# Patient Record
Sex: Female | Born: 1990 | Race: White | Hispanic: No | Marital: Married | Smoking: Never smoker
Health system: Southern US, Community
[De-identification: ages and names within clinical notes are randomized; demographics above are authoritative.]

## PROBLEM LIST (undated history)

## (undated) ENCOUNTER — Inpatient Hospital Stay (HOSPITAL_COMMUNITY): Payer: Self-pay

## (undated) DIAGNOSIS — F32A Depression, unspecified: Secondary | ICD-10-CM

## (undated) DIAGNOSIS — K219 Gastro-esophageal reflux disease without esophagitis: Secondary | ICD-10-CM

## (undated) DIAGNOSIS — R51 Headache: Secondary | ICD-10-CM

## (undated) DIAGNOSIS — F329 Major depressive disorder, single episode, unspecified: Secondary | ICD-10-CM

## (undated) HISTORY — PX: TUBAL LIGATION: SHX77

## (undated) HISTORY — PX: SHOULDER ARTHROSCOPY: SHX128

## (undated) HISTORY — PX: CARPAL TUNNEL RELEASE: SHX101

---
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
## 2009-08-20 ENCOUNTER — Encounter: Admission: RE | Admit: 2009-08-20 | Discharge: 2009-08-20 | Payer: Self-pay | Admitting: Family Medicine

## 2010-07-04 ENCOUNTER — Encounter: Payer: Self-pay | Admitting: Family Medicine

## 2012-01-10 LAB — OB RESULTS CONSOLE GC/CHLAMYDIA: Chlamydia: NEGATIVE

## 2012-06-28 LAB — OB RESULTS CONSOLE GBS: GBS: POSITIVE

## 2012-07-03 ENCOUNTER — Inpatient Hospital Stay (HOSPITAL_COMMUNITY)
Admission: AD | Admit: 2012-07-03 | Discharge: 2012-07-03 | Disposition: A | Discharge status: Home / Self Care | Payer: Managed Care, Other (non HMO) | Source: Ambulatory Visit | Attending: Obstetrics and Gynecology | Admitting: Obstetrics and Gynecology

## 2012-07-03 ENCOUNTER — Encounter (HOSPITAL_COMMUNITY): Payer: Self-pay

## 2012-07-03 DIAGNOSIS — N949 Unspecified condition associated with female genital organs and menstrual cycle: Secondary | ICD-10-CM | POA: Insufficient documentation

## 2012-07-03 DIAGNOSIS — O479 False labor, unspecified: Secondary | ICD-10-CM | POA: Insufficient documentation

## 2012-07-03 HISTORY — DX: Major depressive disorder, single episode, unspecified: F32.9

## 2012-07-03 HISTORY — DX: Headache: R51

## 2012-07-03 HISTORY — DX: Depression, unspecified: F32.A

## 2012-07-03 NOTE — MAU Note (Signed)
Perineum dry prior to exam.  No evidence of fluid on pad.  Small amt of normal white discharge noted on glove with exam, no fluid noted with exam.

## 2012-07-03 NOTE — MAU Note (Signed)
Pink spot at 0600, contracting during the night ~q .  Water leaking around 0800, clear fluid, still coming.

## 2012-07-03 NOTE — MAU Note (Signed)
Patient states she has been having contractions about every 10 minutes. States when she woke up with pinkish leaking. Not actively leaking at this time and not wearing a pad. Reports good fetal movement.

## 2012-07-04 ENCOUNTER — Inpatient Hospital Stay (HOSPITAL_COMMUNITY): Payer: Managed Care, Other (non HMO) | Admitting: Anesthesiology

## 2012-07-04 ENCOUNTER — Encounter (HOSPITAL_COMMUNITY): Payer: Self-pay

## 2012-07-04 ENCOUNTER — Encounter (HOSPITAL_COMMUNITY): Payer: Self-pay | Admitting: Anesthesiology

## 2012-07-04 ENCOUNTER — Encounter (HOSPITAL_COMMUNITY)
Admission: AD | Disposition: A | Discharge status: Home / Self Care | Payer: Self-pay | Source: Ambulatory Visit | Attending: Obstetrics and Gynecology

## 2012-07-04 ENCOUNTER — Inpatient Hospital Stay (HOSPITAL_COMMUNITY)
Admission: AD | Admit: 2012-07-04 | Discharge: 2012-07-06 | DRG: 766 | Disposition: A | Discharge status: Home / Self Care | Payer: Managed Care, Other (non HMO) | Source: Ambulatory Visit | Attending: Obstetrics and Gynecology | Admitting: Obstetrics and Gynecology

## 2012-07-04 DIAGNOSIS — O324XX0 Maternal care for high head at term, not applicable or unspecified: Secondary | ICD-10-CM | POA: Diagnosis present

## 2012-07-04 LAB — OB RESULTS CONSOLE ABO/RH: RH Type: POSITIVE

## 2012-07-04 LAB — CBC
HCT: 35.3 % — ABNORMAL LOW (ref 36.0–46.0)
Hemoglobin: 11.6 g/dL — ABNORMAL LOW (ref 12.0–15.0)
MCHC: 32.9 g/dL (ref 30.0–36.0)
MCV: 81.5 fL (ref 78.0–100.0)
RDW: 13.2 % (ref 11.5–15.5)
WBC: 14.7 10*3/uL — ABNORMAL HIGH (ref 4.0–10.5)

## 2012-07-04 LAB — OB RESULTS CONSOLE RPR: RPR: NONREACTIVE

## 2012-07-04 LAB — OB RESULTS CONSOLE RUBELLA ANTIBODY, IGM: Rubella: NON-IMMUNE/NOT IMMUNE

## 2012-07-04 LAB — OB RESULTS CONSOLE HEPATITIS B SURFACE ANTIGEN: Hepatitis B Surface Ag: NEGATIVE

## 2012-07-04 SURGERY — Surgical Case
Anesthesia: Epidural | Site: Abdomen | Wound class: Clean Contaminated

## 2012-07-04 MED ORDER — DIPHENHYDRAMINE HCL 50 MG/ML IJ SOLN: 25.0000 mg | INTRAMUSCULAR | Status: DC | PRN

## 2012-07-04 MED ORDER — NALBUPHINE HCL 10 MG/ML IJ SOLN: 5.0000 mg | INTRAMUSCULAR | Status: DC | PRN

## 2012-07-04 MED ORDER — SODIUM CHLORIDE 0.9 % IV SOLN
2.0000 g | Freq: Four times a day (QID) | INTRAVENOUS | Status: DC
  Administered 2012-07-04: 2 g via INTRAVENOUS
  Filled 2012-07-04 (×3): qty 2000

## 2012-07-04 MED ORDER — DIPHENHYDRAMINE HCL 50 MG/ML IJ SOLN: 12.5000 mg | INTRAMUSCULAR | Status: DC | PRN

## 2012-07-04 MED ORDER — TETANUS-DIPHTH-ACELL PERTUSSIS 5-2.5-18.5 LF-MCG/0.5 IM SUSP: 0.5000 mL | Freq: Once | INTRAMUSCULAR | Status: DC

## 2012-07-04 MED ORDER — OXYTOCIN 40 UNITS IN LACTATED RINGERS INFUSION - SIMPLE MED: 62.5000 mL/h | INTRAVENOUS | Status: AC

## 2012-07-04 MED ORDER — CEFAZOLIN SODIUM-DEXTROSE 2-3 GM-% IV SOLR
INTRAVENOUS | Status: DC | PRN
  Administered 2012-07-04: 2 g via INTRAVENOUS

## 2012-07-04 MED ORDER — FENTANYL 2.5 MCG/ML BUPIVACAINE 1/10 % EPIDURAL INFUSION (WH - ANES)
14.0000 mL/h | INTRAMUSCULAR | Status: DC
  Administered 2012-07-04: 14 mL/h via EPIDURAL
  Filled 2012-07-04: qty 125

## 2012-07-04 MED ORDER — DIBUCAINE 1 % RE OINT: 1.0000 "application " | TOPICAL_OINTMENT | RECTAL | Status: DC | PRN

## 2012-07-04 MED ORDER — MEPERIDINE HCL 25 MG/ML IJ SOLN
6.2500 mg | INTRAMUSCULAR | Status: DC | PRN
  Administered 2012-07-04: 6.25 mg via INTRAVENOUS

## 2012-07-04 MED ORDER — WITCH HAZEL-GLYCERIN EX PADS: 1.0000 "application " | MEDICATED_PAD | CUTANEOUS | Status: DC | PRN

## 2012-07-04 MED ORDER — MEPERIDINE HCL 25 MG/ML IJ SOLN: 6.2500 mg | INTRAMUSCULAR | Status: DC | PRN

## 2012-07-04 MED ORDER — CITRIC ACID-SODIUM CITRATE 334-500 MG/5ML PO SOLN
30.0000 mL | ORAL | Status: DC | PRN
  Administered 2012-07-04: 30 mL via ORAL
  Filled 2012-07-04: qty 15

## 2012-07-04 MED ORDER — MORPHINE SULFATE (PF) 0.5 MG/ML IJ SOLN
INTRAMUSCULAR | Status: DC | PRN
  Administered 2012-07-04: 4 mg via EPIDURAL

## 2012-07-04 MED ORDER — MIDAZOLAM HCL 2 MG/2ML IJ SOLN: 0.5000 mg | Freq: Once | INTRAMUSCULAR | Status: DC | PRN

## 2012-07-04 MED ORDER — CEFAZOLIN SODIUM-DEXTROSE 2-3 GM-% IV SOLR
INTRAVENOUS | Status: AC
  Filled 2012-07-04: qty 50

## 2012-07-04 MED ORDER — NALOXONE HCL 0.4 MG/ML IJ SOLN: 0.4000 mg | INTRAMUSCULAR | Status: DC | PRN

## 2012-07-04 MED ORDER — SIMETHICONE 80 MG PO CHEW
80.0000 mg | CHEWABLE_TABLET | Freq: Three times a day (TID) | ORAL | Status: DC
  Administered 2012-07-05 – 2012-07-06 (×5): 80 mg via ORAL

## 2012-07-04 MED ORDER — SODIUM BICARBONATE 8.4 % IV SOLN
INTRAVENOUS | Status: AC
  Filled 2012-07-04: qty 50

## 2012-07-04 MED ORDER — IBUPROFEN 600 MG PO TABS
600.0000 mg | ORAL_TABLET | Freq: Four times a day (QID) | ORAL | Status: DC
  Administered 2012-07-05 – 2012-07-06 (×4): 600 mg via ORAL
  Filled 2012-07-04 (×4): qty 1

## 2012-07-04 MED ORDER — IBUPROFEN 600 MG PO TABS: 600.0000 mg | ORAL_TABLET | Freq: Four times a day (QID) | ORAL | Status: DC | PRN

## 2012-07-04 MED ORDER — LIDOCAINE HCL (PF) 1 % IJ SOLN
30.0000 mL | INTRAMUSCULAR | Status: DC | PRN
  Filled 2012-07-04: qty 30

## 2012-07-04 MED ORDER — OXYCODONE-ACETAMINOPHEN 5-325 MG PO TABS: 1.0000 | ORAL_TABLET | ORAL | Status: DC | PRN

## 2012-07-04 MED ORDER — OXYTOCIN BOLUS FROM INFUSION: 500.0000 mL | INTRAVENOUS | Status: DC

## 2012-07-04 MED ORDER — DIPHENHYDRAMINE HCL 25 MG PO CAPS: 25.0000 mg | ORAL_CAPSULE | ORAL | Status: DC | PRN

## 2012-07-04 MED ORDER — DIPHENHYDRAMINE HCL 25 MG PO CAPS: 25.0000 mg | ORAL_CAPSULE | Freq: Four times a day (QID) | ORAL | Status: DC | PRN

## 2012-07-04 MED ORDER — MORPHINE SULFATE 0.5 MG/ML IJ SOLN
INTRAMUSCULAR | Status: AC
  Filled 2012-07-04: qty 10

## 2012-07-04 MED ORDER — MORPHINE SULFATE (PF) 0.5 MG/ML IJ SOLN
INTRAMUSCULAR | Status: DC | PRN
  Administered 2012-07-04: 1 mg via INTRAVENOUS

## 2012-07-04 MED ORDER — ZOLPIDEM TARTRATE 5 MG PO TABS: 5.0000 mg | ORAL_TABLET | Freq: Every evening | ORAL | Status: DC | PRN

## 2012-07-04 MED ORDER — OXYCODONE-ACETAMINOPHEN 5-325 MG PO TABS
1.0000 | ORAL_TABLET | ORAL | Status: DC | PRN
  Administered 2012-07-06: 1 via ORAL
  Filled 2012-07-04: qty 1
  Filled 2012-07-04: qty 2
  Filled 2012-07-04: qty 1

## 2012-07-04 MED ORDER — MENTHOL 3 MG MT LOZG: 1.0000 | LOZENGE | OROMUCOSAL | Status: DC | PRN

## 2012-07-04 MED ORDER — PHENYLEPHRINE 40 MCG/ML (10ML) SYRINGE FOR IV PUSH (FOR BLOOD PRESSURE SUPPORT)
80.0000 ug | PREFILLED_SYRINGE | INTRAVENOUS | Status: DC | PRN
  Filled 2012-07-04: qty 5

## 2012-07-04 MED ORDER — PRENATAL MULTIVITAMIN CH
1.0000 | ORAL_TABLET | Freq: Every day | ORAL | Status: DC
  Administered 2012-07-06: 1 via ORAL
  Filled 2012-07-04: qty 1

## 2012-07-04 MED ORDER — PROMETHAZINE HCL 25 MG/ML IJ SOLN: 6.2500 mg | INTRAMUSCULAR | Status: DC | PRN

## 2012-07-04 MED ORDER — LIDOCAINE-EPINEPHRINE (PF) 2 %-1:200000 IJ SOLN
INTRAMUSCULAR | Status: AC
  Filled 2012-07-04: qty 20

## 2012-07-04 MED ORDER — OXYTOCIN 40 UNITS IN LACTATED RINGERS INFUSION - SIMPLE MED
1.0000 m[IU]/min | INTRAVENOUS | Status: DC
  Administered 2012-07-04: 1 m[IU]/min via INTRAVENOUS
  Filled 2012-07-04: qty 1000

## 2012-07-04 MED ORDER — NALOXONE HCL 1 MG/ML IJ SOLN: 1.0000 ug/kg/h | INTRAVENOUS | Status: DC | PRN

## 2012-07-04 MED ORDER — SENNOSIDES-DOCUSATE SODIUM 8.6-50 MG PO TABS
2.0000 | ORAL_TABLET | Freq: Every day | ORAL | Status: DC
  Administered 2012-07-05: 2 via ORAL

## 2012-07-04 MED ORDER — ONDANSETRON HCL 4 MG/2ML IJ SOLN: 4.0000 mg | INTRAMUSCULAR | Status: DC | PRN

## 2012-07-04 MED ORDER — ONDANSETRON HCL 4 MG/2ML IJ SOLN
INTRAMUSCULAR | Status: AC
  Filled 2012-07-04: qty 2

## 2012-07-04 MED ORDER — OXYTOCIN 10 UNIT/ML IJ SOLN
INTRAMUSCULAR | Status: AC
  Filled 2012-07-04: qty 4

## 2012-07-04 MED ORDER — OXYTOCIN 40 UNITS IN LACTATED RINGERS INFUSION - SIMPLE MED: 62.5000 mL/h | INTRAVENOUS | Status: DC

## 2012-07-04 MED ORDER — ACETAMINOPHEN 325 MG PO TABS: 650.0000 mg | ORAL_TABLET | ORAL | Status: DC | PRN

## 2012-07-04 MED ORDER — ONDANSETRON HCL 4 MG/2ML IJ SOLN
4.0000 mg | Freq: Four times a day (QID) | INTRAMUSCULAR | Status: DC | PRN
  Administered 2012-07-04: 4 mg via INTRAVENOUS
  Filled 2012-07-04: qty 2

## 2012-07-04 MED ORDER — SIMETHICONE 80 MG PO CHEW: 80.0000 mg | CHEWABLE_TABLET | ORAL | Status: DC | PRN

## 2012-07-04 MED ORDER — METOCLOPRAMIDE HCL 5 MG/ML IJ SOLN: 10.0000 mg | Freq: Three times a day (TID) | INTRAMUSCULAR | Status: DC | PRN

## 2012-07-04 MED ORDER — KETOROLAC TROMETHAMINE 30 MG/ML IJ SOLN
INTRAMUSCULAR | Status: AC
  Filled 2012-07-04: qty 1

## 2012-07-04 MED ORDER — TERBUTALINE SULFATE 1 MG/ML IJ SOLN: 0.2500 mg | Freq: Once | INTRAMUSCULAR | Status: DC | PRN

## 2012-07-04 MED ORDER — LACTATED RINGERS IV SOLN
INTRAVENOUS | Status: DC
  Administered 2012-07-05: 03:00:00 via INTRAVENOUS

## 2012-07-04 MED ORDER — FLEET ENEMA 7-19 GM/118ML RE ENEM: 1.0000 | ENEMA | RECTAL | Status: DC | PRN

## 2012-07-04 MED ORDER — SCOPOLAMINE 1 MG/3DAYS TD PT72
1.0000 | MEDICATED_PATCH | Freq: Once | TRANSDERMAL | Status: DC
  Administered 2012-07-04: 1.5 mg via TRANSDERMAL

## 2012-07-04 MED ORDER — KETOROLAC TROMETHAMINE 30 MG/ML IJ SOLN: 30.0000 mg | Freq: Four times a day (QID) | INTRAMUSCULAR | Status: AC | PRN

## 2012-07-04 MED ORDER — 0.9 % SODIUM CHLORIDE (POUR BTL) OPTIME
TOPICAL | Status: DC | PRN
  Administered 2012-07-04: 1000 mL

## 2012-07-04 MED ORDER — SCOPOLAMINE 1 MG/3DAYS TD PT72
MEDICATED_PATCH | TRANSDERMAL | Status: AC
  Filled 2012-07-04: qty 1

## 2012-07-04 MED ORDER — SODIUM BICARBONATE 8.4 % IV SOLN
INTRAVENOUS | Status: DC | PRN
  Administered 2012-07-04: 5 mL via EPIDURAL

## 2012-07-04 MED ORDER — MEPERIDINE HCL 25 MG/ML IJ SOLN
INTRAMUSCULAR | Status: AC
  Filled 2012-07-04: qty 1

## 2012-07-04 MED ORDER — ONDANSETRON HCL 4 MG/2ML IJ SOLN
INTRAMUSCULAR | Status: DC | PRN
  Administered 2012-07-04: 4 mg via INTRAVENOUS

## 2012-07-04 MED ORDER — LACTATED RINGERS IV SOLN
500.0000 mL | INTRAVENOUS | Status: DC | PRN
  Administered 2012-07-04 (×2): 300 mL via INTRAVENOUS

## 2012-07-04 MED ORDER — EPHEDRINE 5 MG/ML INJ
10.0000 mg | INTRAVENOUS | Status: DC | PRN
  Filled 2012-07-04: qty 4

## 2012-07-04 MED ORDER — KETOROLAC TROMETHAMINE 30 MG/ML IJ SOLN
30.0000 mg | Freq: Four times a day (QID) | INTRAMUSCULAR | Status: AC | PRN
  Administered 2012-07-04: 30 mg via INTRAVENOUS

## 2012-07-04 MED ORDER — ONDANSETRON HCL 4 MG PO TABS: 4.0000 mg | ORAL_TABLET | ORAL | Status: DC | PRN

## 2012-07-04 MED ORDER — CEFAZOLIN SODIUM 1-5 GM-% IV SOLN
1.0000 g | Freq: Four times a day (QID) | INTRAVENOUS | Status: AC
  Administered 2012-07-05 (×2): 1 g via INTRAVENOUS
  Filled 2012-07-04 (×2): qty 50

## 2012-07-04 MED ORDER — LIDOCAINE HCL (PF) 1 % IJ SOLN
INTRAMUSCULAR | Status: DC | PRN
  Administered 2012-07-04 (×2): 5 mL

## 2012-07-04 MED ORDER — CEFAZOLIN SODIUM-DEXTROSE 2-3 GM-% IV SOLR: 2.0000 g | INTRAVENOUS | Status: DC

## 2012-07-04 MED ORDER — LACTATED RINGERS IV SOLN
INTRAVENOUS | Status: DC
  Administered 2012-07-04 (×5): via INTRAVENOUS

## 2012-07-04 MED ORDER — FENTANYL CITRATE 0.05 MG/ML IJ SOLN: 25.0000 ug | INTRAMUSCULAR | Status: DC | PRN

## 2012-07-04 MED ORDER — LANOLIN HYDROUS EX OINT: 1.0000 "application " | TOPICAL_OINTMENT | CUTANEOUS | Status: DC | PRN

## 2012-07-04 MED ORDER — SODIUM CHLORIDE 0.9 % IV SOLN
2.0000 g | Freq: Once | INTRAVENOUS | Status: AC
  Administered 2012-07-04: 2 g via INTRAVENOUS
  Filled 2012-07-04: qty 2000

## 2012-07-04 MED ORDER — PHENYLEPHRINE 40 MCG/ML (10ML) SYRINGE FOR IV PUSH (FOR BLOOD PRESSURE SUPPORT): 80.0000 ug | PREFILLED_SYRINGE | INTRAVENOUS | Status: DC | PRN

## 2012-07-04 MED ORDER — ONDANSETRON HCL 4 MG/2ML IJ SOLN: 4.0000 mg | Freq: Three times a day (TID) | INTRAMUSCULAR | Status: DC | PRN

## 2012-07-04 MED ORDER — ACETAMINOPHEN 10 MG/ML IV SOLN: 1000.0000 mg | Freq: Four times a day (QID) | INTRAVENOUS | Status: AC | PRN

## 2012-07-04 MED ORDER — LACTATED RINGERS IV SOLN
500.0000 mL | Freq: Once | INTRAVENOUS | Status: AC
  Administered 2012-07-04: 500 mL via INTRAVENOUS

## 2012-07-04 MED ORDER — BUTORPHANOL TARTRATE 1 MG/ML IJ SOLN
1.0000 mg | INTRAMUSCULAR | Status: DC | PRN
  Administered 2012-07-04 (×2): 1 mg via INTRAVENOUS
  Filled 2012-07-04 (×2): qty 1

## 2012-07-04 MED ORDER — SODIUM CHLORIDE 0.9 % IJ SOLN: 3.0000 mL | INTRAMUSCULAR | Status: DC | PRN

## 2012-07-04 MED ORDER — EPHEDRINE 5 MG/ML INJ: 10.0000 mg | INTRAVENOUS | Status: DC | PRN

## 2012-07-04 MED ORDER — OXYTOCIN 10 UNIT/ML IJ SOLN
40.0000 [IU] | INTRAVENOUS | Status: DC | PRN
  Administered 2012-07-04: 40 [IU] via INTRAVENOUS

## 2012-07-04 SURGICAL SUPPLY — 38 items
ADH SKN CLS APL DERMABOND .7 (GAUZE/BANDAGES/DRESSINGS)
APL SKNCLS STERI-STRIP NONHPOA (GAUZE/BANDAGES/DRESSINGS) ×2
BENZOIN TINCTURE PRP APPL 2/3 (GAUZE/BANDAGES/DRESSINGS) ×2 IMPLANT
CLOTH BEACON ORANGE TIMEOUT ST (SAFETY) ×2 IMPLANT
CONTAINER PREFILL 10% NBF 15ML (MISCELLANEOUS) IMPLANT
DERMABOND ADVANCED (GAUZE/BANDAGES/DRESSINGS)
DERMABOND ADVANCED .7 DNX12 (GAUZE/BANDAGES/DRESSINGS) IMPLANT
DRAPE LG THREE QUARTER DISP (DRAPES) ×2 IMPLANT
DRESSING TELFA 8X3 (GAUZE/BANDAGES/DRESSINGS) IMPLANT
DRSG OPSITE POSTOP 4X10 (GAUZE/BANDAGES/DRESSINGS) ×2 IMPLANT
DURAPREP 26ML APPLICATOR (WOUND CARE) ×2 IMPLANT
ELECT REM PT RETURN 9FT ADLT (ELECTROSURGICAL) ×2
ELECTRODE REM PT RTRN 9FT ADLT (ELECTROSURGICAL) ×1 IMPLANT
EXTRACTOR VACUUM M CUP 4 TUBE (SUCTIONS) IMPLANT
GAUZE SPONGE 4X4 12PLY STRL LF (GAUZE/BANDAGES/DRESSINGS) ×4 IMPLANT
GLOVE BIO SURGEON STRL SZ8 (GLOVE) ×4 IMPLANT
GOWN PREVENTION PLUS LG XLONG (DISPOSABLE) ×2 IMPLANT
KIT ABG SYR 3ML LUER SLIP (SYRINGE) ×2 IMPLANT
NDL HYPO 25X5/8 SAFETYGLIDE (NEEDLE) ×1 IMPLANT
NEEDLE HYPO 25X5/8 SAFETYGLIDE (NEEDLE) ×2 IMPLANT
NS IRRIG 1000ML POUR BTL (IV SOLUTION) ×2 IMPLANT
PACK C SECTION WH (CUSTOM PROCEDURE TRAY) ×2 IMPLANT
PAD ABD 7.5X8 STRL (GAUZE/BANDAGES/DRESSINGS) IMPLANT
PAD OB MATERNITY 4.3X12.25 (PERSONAL CARE ITEMS) ×2 IMPLANT
SLEEVE SCD COMPRESS KNEE MED (MISCELLANEOUS) IMPLANT
STAPLER VISISTAT 35W (STAPLE) IMPLANT
STRIP CLOSURE SKIN 1/2X4 (GAUZE/BANDAGES/DRESSINGS) ×2 IMPLANT
SUT CHROMIC 2 0 SH (SUTURE) ×2 IMPLANT
SUT MNCRL 0 VIOLET CTX 36 (SUTURE) ×5 IMPLANT
SUT MONOCRYL 0 CTX 36 (SUTURE) ×5
SUT PDS AB 0 CTX 60 (SUTURE) ×2 IMPLANT
SUT PLAIN 0 NONE (SUTURE) IMPLANT
SUT PLAIN 2 0 (SUTURE) ×2
SUT PLAIN ABS 2-0 CT1 27XMFL (SUTURE) IMPLANT
SUT VIC AB 4-0 KS 27 (SUTURE) ×2 IMPLANT
TOWEL OR 17X24 6PK STRL BLUE (TOWEL DISPOSABLE) ×6 IMPLANT
TRAY FOLEY CATH 14FR (SET/KITS/TRAYS/PACK) ×1 IMPLANT
WATER STERILE IRR 1000ML POUR (IV SOLUTION) ×1 IMPLANT

## 2012-07-04 NOTE — Progress Notes (Signed)
C/C/+2 with some caput FHT + accels Pushing with good effort-getting tired D/W patient-will push and check in about 30 min

## 2012-07-04 NOTE — Anesthesia Postprocedure Evaluation (Signed)
  Anesthesia Post-op Note  Patient: Lisa Bates  Procedure(s) Performed: Procedure(s) (LRB) with comments: CESAREAN SECTION (N/A) - Primary Cesarean Section Delivery Baby Girl @ 1840, Apgars 3/9   Patient is awake, responsive, moving her legs, and has signs of resolution of her numbness. Pain and nausea are reasonably well controlled. Vital signs are stable and clinically acceptable. Oxygen saturation is clinically acceptable. There are no apparent anesthetic complications at this time. Patient is ready for discharge.

## 2012-07-04 NOTE — Anesthesia Procedure Notes (Signed)
Epidural Patient location during procedure: OB Start time: 07/04/2012 12:18 PM  Staffing Anesthesiologist: Angus Seller., Harrell Gave. Performed by: anesthesiologist   Preanesthetic Checklist Completed: patient identified, site marked, surgical consent, pre-op evaluation, timeout performed, IV checked, risks and benefits discussed and monitors and equipment checked  Epidural Patient position: sitting Prep: site prepped and draped and DuraPrep Patient monitoring: continuous pulse ox and blood pressure Approach: midline Injection technique: LOR air and LOR saline  Needle:  Needle type: Tuohy  Needle gauge: 17 G Needle length: 9 cm and 9 Needle insertion depth: 5 cm cm Catheter type: closed end flexible Catheter size: 19 Gauge Catheter at skin depth: 10 cm Test dose: negative  Assessment Events: blood not aspirated, injection not painful, no injection resistance, negative IV test and no paresthesia  Additional Notes Patient identified.  Risk benefits discussed including failed block, incomplete pain control, headache, nerve damage, paralysis, blood pressure changes, nausea, vomiting, reactions to medication both toxic or allergic, and postpartum back pain.  Patient expressed understanding and wished to proceed.  All questions were answered.  Sterile technique used throughout procedure and epidural site dressed with sterile barrier dressing. No paresthesia or other complications noted.The patient did not experience any signs of intravascular injection such as tinnitus or metallic taste in mouth nor signs of intrathecal spread such as rapid motor block. Please see nursing notes for vital signs.

## 2012-07-04 NOTE — Progress Notes (Signed)
FHT + accels, some early decels with UCs UCs about q 2-7 min Pitocin on Cx no change per nurse check

## 2012-07-04 NOTE — MAU Note (Signed)
Notified Dr. Henderson Cloud patient G1 [redacted]w[redacted]d labor evaluation, patient has been observed for a time with a cervical change was seen yesterday ft, today 4/70/-2 intact, GBS positive, MD to place admission orders.

## 2012-07-04 NOTE — H&P (Signed)
Lisa Bates is a 22 y.o. female presenting for C/O UCs. No ROM, no HA/vision change, no epigastric pain. Maternal Medical History:  Reason for admission: Reason for admission: contractions.  Contractions: Onset was 6-12 hours ago.   Frequency: irregular.   Perceived severity is moderate.    Fetal activity: Perceived fetal activity is normal.      OB History    Grav Para Term Preterm Abortions TAB SAB Ect Mult Living   1              Past Medical History  Diagnosis Date  . Headache   . Depression     not on meds, doing good   Past Surgical History  Procedure Date  . No past surgeries    Family History: family history is negative for Other. Social History:  reports that she has never smoked. She has never used smokeless tobacco. She reports that she does not drink alcohol or use illicit drugs.   Prenatal Transfer Tool  Maternal Diabetes: No Genetic Screening: Normal Maternal Ultrasounds/Referrals: Normal Fetal Ultrasounds or other Referrals:  None Maternal Substance Abuse:  No Significant Maternal Medications:  None Significant Maternal Lab Results:  None Other Comments:  None  Review of Systems  Eyes: Negative for blurred vision.  Gastrointestinal: Negative for abdominal pain.  Neurological: Negative for headaches.    Dilation: 4 Effacement (%): 100 Station: -1 Exam by:: Dr Henderson Cloud Blood pressure 137/82, pulse 78, temperature 97.3 F (36.3 C), temperature source Oral, resp. rate 18, height 5\' 4"  (1.626 m), weight 158 lb (71.668 kg), SpO2 100.00%. Maternal Exam:  Uterine Assessment: Contraction strength is moderate.  Contraction frequency is irregular.   Abdomen: Fetal presentation: vertex     Fetal Exam Fetal Monitor Review: Pattern: accelerations present.       Physical Exam  Cardiovascular: Normal rate and regular rhythm.   Respiratory: Effort normal and breath sounds normal.  GI: There is no tenderness.  Neurological: She has normal reflexes.     AROM clear Prenatal labs: ABO, Rh:   Antibody:   Rubella:   RPR:    HBsAg:    HIV:    GBS:     Assessment/Plan: 22 yo G1P0 at 57 3/7 weeks in labor.   Lisa Bates,Lisa Bates 07/04/2012, 9:17 AM

## 2012-07-04 NOTE — MAU Note (Signed)
contractions 

## 2012-07-04 NOTE — Progress Notes (Signed)
Henderson Cloud, MD, at bedside. Informs patient of the risks and benefits of a cesarean section. Patient verbalizes understanding. Consents signed. Patient prepped for OR.

## 2012-07-04 NOTE — Transfer of Care (Signed)
Immediate Anesthesia Transfer of Care Note  Patient: Lisa Bates  Procedure(s) Performed: Procedure(s) (LRB) with comments: CESAREAN SECTION (N/A) - Primary Cesarean Section Delivery Baby Girl @ 1840, Apgars 3/9   Patient Location: PACU  Anesthesia Type:Epidural  Level of Consciousness: awake  Airway & Oxygen Therapy: Patient Spontanous Breathing  Post-op Assessment: Report given to PACU RN and Post -op Vital signs reviewed and stable  Post vital signs: stable  Complications: No apparent anesthesia complications

## 2012-07-04 NOTE — Progress Notes (Signed)
Pushing x 1.75hours, exhausted No progress, vtx at +1 to +2 with caput FHT reactive D/W patient arrest of descent, rec: cesarean section D/W risks-infection, organ damage, bleeding/transfusion-HIV/Hep, DVT/PE, pneumonia. All questions answered.

## 2012-07-04 NOTE — Brief Op Note (Signed)
07/04/2012  7:40 PM  PATIENT:  Lisa Bates  22 y.o. female  PRE-OPERATIVE DIAGNOSIS:  Failure to Descend  POST-OPERATIVE DIAGNOSIS:  Failure to Descend  PROCEDURE:  Procedure(s) (LRB) with comments: CESAREAN SECTION (N/A) - Primary Cesarean Section Delivery Baby Girl @ 1840, Apgars 3/9   SURGEON:  Surgeon(s) and Role:    * Leslie Andrea, MD - Primary  PHYSICIAN ASSISTANT:   ASSISTANTS: none   ANESTHESIA:   epidural  EBL:  Total I/O In: 400 [I.V.:400] Out: 700 [Urine:100; Blood:600]  BLOOD ADMINISTERED:none  DRAINS: Urinary Catheter (Foley)   LOCAL MEDICATIONS USED:  NONE  SPECIMEN:  Source of Specimen:  placenta  DISPOSITION OF SPECIMEN:  PATHOLOGY  COUNTS:  YES  TOURNIQUET:  * No tourniquets in log *  DICTATION: .Other Dictation: Dictation Number [id number]  PLAN OF CARE: Admit to inpatient   PATIENT DISPOSITION:  PACU - hemodynamically stable.   Delay start of Pharmacological VTE agent (>24hrs) due to surgical blood loss or risk of bleeding: not applicable

## 2012-07-04 NOTE — Anesthesia Preprocedure Evaluation (Signed)
Anesthesia Evaluation  Patient identified by MRN, date of birth, ID band Patient awake    Reviewed: Allergy & Precautions, H&P , Patient's Chart, lab work & pertinent test results  Airway Mallampati: II TM Distance: >3 FB Neck ROM: full    Dental No notable dental hx.    Pulmonary neg pulmonary ROS,  breath sounds clear to auscultation  Pulmonary exam normal       Cardiovascular negative cardio ROS  Rhythm:regular Rate:Normal     Neuro/Psych  Headaches, PSYCHIATRIC DISORDERS Depression negative neurological ROS  negative psych ROS   GI/Hepatic negative GI ROS, Neg liver ROS,   Endo/Other  negative endocrine ROS  Renal/GU negative Renal ROS     Musculoskeletal   Abdominal   Peds  Hematology negative hematology ROS (+)   Anesthesia Other Findings   Reproductive/Obstetrics (+) Pregnancy                           Anesthesia Physical Anesthesia Plan  ASA: II  Anesthesia Plan: Epidural   Post-op Pain Management:    Induction:   Airway Management Planned:   Additional Equipment:   Intra-op Plan:   Post-operative Plan:   Informed Consent: I have reviewed the patients History and Physical, chart, labs and discussed the procedure including the risks, benefits and alternatives for the proposed anesthesia with the patient or authorized representative who has indicated his/her understanding and acceptance.     Plan Discussed with:   Anesthesia Plan Comments:         Anesthesia Quick Evaluation

## 2012-07-05 ENCOUNTER — Encounter (HOSPITAL_COMMUNITY): Payer: Self-pay | Admitting: Obstetrics and Gynecology

## 2012-07-05 LAB — CBC
MCHC: 32.1 g/dL (ref 30.0–36.0)
Platelets: 153 10*3/uL (ref 150–400)
RDW: 13.4 % (ref 11.5–15.5)
WBC: 14.9 10*3/uL — ABNORMAL HIGH (ref 4.0–10.5)

## 2012-07-05 LAB — RPR: RPR Ser Ql: NONREACTIVE

## 2012-07-05 NOTE — Addendum Note (Signed)
Addendum  created 07/05/12 0817 by Suella Grove, CRNA   Modules edited:Notes Section

## 2012-07-05 NOTE — Progress Notes (Signed)
Subjective: Postpartum Day 1: Cesarean Delivery Patient reports tolerating PO.    Objective: Vital signs in last 24 hours: Temp:  [97.3 F (36.3 C)-99 F (37.2 C)] 97.5 F (36.4 C) (01/23 0630) Pulse Rate:  [64-147] 73  (01/23 0630) Resp:  [15-23] 18  (01/23 0630) BP: (93-146)/(51-104) 101/62 mmHg (01/23 0630) SpO2:  [88 %-100 %] 97 % (01/23 0630)  Physical Exam:  General: alert and cooperative Lochia: appropriate Uterine Fundus: firm Incision: honeycomb dressing noted with scant old drainage noted on bandage DVT Evaluation: No evidence of DVT seen on physical exam. Negative Homan's sign. No significant calf/ankle edema. Foley with adequate op  Basename 07/05/12 0525 07/04/12 0800  HGB 9.3* 11.6*  HCT 24.9* 35.3*    Assessment/Plan: Status post Cesarean section. Doing well postoperatively.  Continue current care.  Kalub Morillo G 07/05/2012, 8:05 AM

## 2012-07-05 NOTE — Op Note (Signed)
NAMEMarland Kitchen  Lisa Bates, Lisa Bates NO.:  [id number]  MEDICAL RECORD NO.:  [id number]  LOCATION:  9117                          FACILITY:  [facility]  PHYSICIAN:  Guy Sandifer. Henderson Cloud, M.D. DATE OF BIRTH:  1990-08-25  DATE OF PROCEDURE:  07/04/2012 DATE OF DISCHARGE:                              OPERATIVE REPORT   PREOPERATIVE DIAGNOSIS:  Arrest of descent.  POSTOPERATIVE DIAGNOSIS:  Arrest of descent.  PROCEDURE:  Cesarean section with T-shaped uterine incision.  SURGEON:  Guy Sandifer. Henderson Cloud, M.D.  ANESTHESIA:  Epidural, Brayton Caves, MD  ESTIMATED BLOOD LOSS:  1000 mL.  FINDINGS:  Viable female infant, Apgars of 3 and 9 at 1 and 5 minutes respectively.  Arterial cord pH 7.16.  Birth weight pending.  SPECIMENS:  Placenta to pathology.  INDICATIONS AND CONSENT:  This patient is a 22 year old, G1, P0, at 68 and 3/7th weeks who presents in labor.  She progressed to complete and pushing.  She pushes for about 1-3/4 hours.  She is exhausted and this had been arrested at +1 to +2 station.  Cesarean section was recommended.  Potential risks and complications were discussed preoperatively including, but not limited to, infection, organ damage, bleeding requiring transfusion of blood products with HIV and hepatitis acquisition, DVT, PE, pneumonia.  All questions were answered and consent is signed on the chart.  DESCRIPTION OF PROCEDURE: The patient was taken to the operating room.  She was identified and her epidural anesthetic was augmented to surgical level.  She was placed in a dorsal supine position with a 15-degree left lateral wedge.  Foley catheter was already in place.  She has prepped per [facility] protocol.  Time-out undertaken.  She was draped in sterile fashion. After testing for adequate epidural anesthesia, skin was entered through a Pfannenstiel incision and dissection carried out in layers to the peritoneum.  Peritoneum was incised, extended superiorly and  inferiorly. Vesicouterine peritoneum was taken down cephalad laterally.  The bladder flap was developed.  The bladder blade was placed.  Uterus was incised in a low transverse manner.  The uterine cavity was entered bluntly with a hemostat and the uterine incision was extended cephalad laterally with fingers.  Attempts to deliver the vertex note that the head is firmly wedged in the pelvis.  The nurse then elevated the vertex from below. This again failed to deliver the vertex.  At this point, I extended the skin incision on either side with bandage scissors, and do a midline vertical extension of the uterine incision under good visualization.  At this point, Dr. Su Hilt enters the room and elevates the vertex.  I am able to get my hand around the vertex, but I am unable at that point to deliver the vertex still.  Dr. Despina Hidden then enters the room and was gowned and gloved, and the vertex was delivered.  The baby is delivered and good cry and tone was noted.  Cord was clamped and cut.  The baby is handed to awaiting pediatrics team.  Placenta was manually delivered and sent to pathology.  Uterus was exteriorized.  The vertical extension was closed in 2 locking layers of 0 Monocryl.  The transverse  incision was closed with 2 running locking imbricating layers of 0 Monocryl suture. A 2-0 chromic suture was used to obtain complete hemostasis.  Tubes and ovaries were normal.  Uterus was returned to the abdomen.  Good hemostasis was noted.  The anterior peritoneum was closed in a running fashion with 0 Monocryl suture which was also used to reapproximate the pyramidalis muscle in midline.  Anterior rectus fascia was closed in a running fashion with a 0 looped PDS suture.  Subcutaneous layers were reapproximated with interrupted plain sutures and the skin was closed with a subcuticular 4-0 Vicryl on a Keith needle.  Dressings were applied.  All counts were correct.  The patient was transferred  to recovery room in stable condition.     Guy Sandifer Henderson Cloud, M.D.     JET/MEDQ  D:  07/04/2012  T:  07/05/2012  Job:  [id number]

## 2012-07-05 NOTE — Progress Notes (Signed)
Patient was referred for history of depression/anxiety. * Referral screened out by Clinical Social Worker because none of the following criteria appear to apply:  ~ History of anxiety/depression during this pregnancy, or of post-partum depression.  ~ Diagnosis of anxiety and/or depression within last 3 years  ~ History of depression due to pregnancy loss/loss of child  OR * Patient's symptoms currently being treated with medication and/or therapy.  Please contact the Clinical Social Worker if needs arise, or by the patient's request. No depression symptoms since high school, as per chart review.

## 2012-07-05 NOTE — Anesthesia Postprocedure Evaluation (Signed)
  Anesthesia Post-op Note  Patient: Lisa Bates  Procedure(s) Performed: Procedure(s) (LRB) with comments: CESAREAN SECTION (N/A) - Primary Cesarean Section Delivery Baby Girl @ 1840, Apgars 3/9   Patient Location: Mother/Baby  Anesthesia Type:Epidural  Level of Consciousness: awake  Airway and Oxygen Therapy: Patient Spontanous Breathing  Post-op Pain: none  Post-op Assessment: Patient's Cardiovascular Status Stable, Respiratory Function Stable, Patent Airway, No signs of Nausea or vomiting, Adequate PO intake, Pain level controlled, No headache, No backache, No residual numbness and No residual motor weakness  Post-op Vital Signs: Reviewed and stable  Complications: No apparent anesthesia complications

## 2012-07-06 MED ORDER — IBUPROFEN 600 MG PO TABS: 600.0000 mg | ORAL_TABLET | Freq: Four times a day (QID) | ORAL | Status: DC

## 2012-07-06 MED ORDER — MEASLES, MUMPS & RUBELLA VAC ~~LOC~~ INJ
0.5000 mL | INJECTION | Freq: Once | SUBCUTANEOUS | Status: AC
  Administered 2012-07-06: 0.5 mL via SUBCUTANEOUS
  Filled 2012-07-06: qty 0.5

## 2012-07-06 MED ORDER — OXYCODONE-ACETAMINOPHEN 5-325 MG PO TABS: 1.0000 | ORAL_TABLET | ORAL | Status: DC | PRN

## 2012-07-06 NOTE — Discharge Summary (Signed)
Obstetric Discharge Summary Reason for Admission: onset of labor Prenatal Procedures: ultrasound Intrapartum Procedures: cesarean: low cervical, transverse Postpartum Procedures: none Complications-Operative and Postpartum: none Hemoglobin  Date Value Range Status  07/05/2012 9.3* 12.0 - 15.0 g/dL Final     DELTA CHECK NOTED     REPEATED TO VERIFY     HCT  Date Value Range Status  07/05/2012 24.9* 36.0 - 46.0 % Final    Physical Exam:  General: alert and cooperative Lochia: appropriate Uterine Fundus: firm Incision: honeycomb dressing intact, no evidence of drainage  DVT Evaluation: No evidence of DVT seen on physical exam. No significant calf/ankle edema.  Discharge Diagnoses: Term Pregnancy-delivered  Discharge Information: Date: 07/06/2012 Activity: pelvic rest Diet: routine Medications: PNV, Ibuprofen and Percocet Condition: stable Instructions: refer to practice specific booklet Discharge to: home   Newborn Data: Live born female  Birth Weight: 7 lb 4.6 oz (3305 g) APGAR: 3, 9  Home with mother.  Whittney Steenson G 07/06/2012, 8:03 AM

## 2014-04-14 ENCOUNTER — Encounter (HOSPITAL_COMMUNITY): Payer: Self-pay | Admitting: Obstetrics and Gynecology

## 2014-04-21 LAB — OB RESULTS CONSOLE ANTIBODY SCREEN: Antibody Screen: NEGATIVE

## 2014-04-21 LAB — OB RESULTS CONSOLE ABO/RH: RH TYPE: POSITIVE

## 2014-04-21 LAB — OB RESULTS CONSOLE HEPATITIS B SURFACE ANTIGEN: HEP B S AG: NEGATIVE

## 2014-04-21 LAB — OB RESULTS CONSOLE RUBELLA ANTIBODY, IGM: Rubella: IMMUNE

## 2014-04-21 LAB — OB RESULTS CONSOLE RPR: RPR: NONREACTIVE

## 2014-04-21 LAB — OB RESULTS CONSOLE HIV ANTIBODY (ROUTINE TESTING): HIV: NONREACTIVE

## 2014-05-06 LAB — OB RESULTS CONSOLE GC/CHLAMYDIA
CHLAMYDIA, DNA PROBE: NEGATIVE
Gonorrhea: NEGATIVE

## 2014-05-13 ENCOUNTER — Emergency Department (HOSPITAL_BASED_OUTPATIENT_CLINIC_OR_DEPARTMENT_OTHER)
Admission: EM | Admit: 2014-05-13 | Discharge: 2014-05-13 | Disposition: A | Discharge status: Home / Self Care | Payer: BC Managed Care – PPO | Attending: Emergency Medicine | Admitting: Emergency Medicine

## 2014-05-13 ENCOUNTER — Inpatient Hospital Stay (HOSPITAL_COMMUNITY)
Admission: AD | Admit: 2014-05-13 | Payer: Managed Care, Other (non HMO) | Source: Ambulatory Visit | Admitting: Obstetrics and Gynecology

## 2014-05-13 ENCOUNTER — Encounter (HOSPITAL_BASED_OUTPATIENT_CLINIC_OR_DEPARTMENT_OTHER): Payer: Self-pay | Admitting: *Deleted

## 2014-05-13 DIAGNOSIS — O9A211 Injury, poisoning and certain other consequences of external causes complicating pregnancy, first trimester: Secondary | ICD-10-CM | POA: Diagnosis present

## 2014-05-13 DIAGNOSIS — S0093XA Contusion of unspecified part of head, initial encounter: Secondary | ICD-10-CM

## 2014-05-13 DIAGNOSIS — S0083XA Contusion of other part of head, initial encounter: Secondary | ICD-10-CM | POA: Insufficient documentation

## 2014-05-13 DIAGNOSIS — Y9389 Activity, other specified: Secondary | ICD-10-CM | POA: Diagnosis not present

## 2014-05-13 DIAGNOSIS — S4992XA Unspecified injury of left shoulder and upper arm, initial encounter: Secondary | ICD-10-CM | POA: Diagnosis not present

## 2014-05-13 DIAGNOSIS — Z3A12 12 weeks gestation of pregnancy: Secondary | ICD-10-CM | POA: Insufficient documentation

## 2014-05-13 DIAGNOSIS — Y92410 Unspecified street and highway as the place of occurrence of the external cause: Secondary | ICD-10-CM | POA: Insufficient documentation

## 2014-05-13 DIAGNOSIS — Z8659 Personal history of other mental and behavioral disorders: Secondary | ICD-10-CM | POA: Insufficient documentation

## 2014-05-13 DIAGNOSIS — Y998 Other external cause status: Secondary | ICD-10-CM | POA: Insufficient documentation

## 2014-05-13 DIAGNOSIS — S4991XA Unspecified injury of right shoulder and upper arm, initial encounter: Secondary | ICD-10-CM | POA: Diagnosis not present

## 2014-05-13 DIAGNOSIS — M25512 Pain in left shoulder: Secondary | ICD-10-CM

## 2014-05-13 DIAGNOSIS — S199XXA Unspecified injury of neck, initial encounter: Secondary | ICD-10-CM | POA: Insufficient documentation

## 2014-05-13 DIAGNOSIS — M542 Cervicalgia: Secondary | ICD-10-CM

## 2014-05-13 DIAGNOSIS — Z79899 Other long term (current) drug therapy: Secondary | ICD-10-CM | POA: Diagnosis not present

## 2014-05-13 DIAGNOSIS — M25511 Pain in right shoulder: Secondary | ICD-10-CM

## 2014-05-13 MED ORDER — HYDROCODONE-ACETAMINOPHEN 5-325 MG PO TABS: 2.0000 | ORAL_TABLET | ORAL | Status: DC | PRN

## 2014-05-13 NOTE — Discharge Instructions (Signed)
Concussion °A concussion, or closed-head injury, is a brain injury caused by a direct blow to the head or by a quick and sudden movement (jolt) of the head or neck. Concussions are usually not life-threatening. Even so, the effects of a concussion can be serious. If you have had a concussion before, you are more likely to experience concussion-like symptoms after a direct blow to the head.  °CAUSES °· Direct blow to the head, such as from running into another player during a soccer game, being hit in a fight, or hitting your head on a hard surface. °· A jolt of the head or neck that causes the brain to move back and forth inside the skull, such as in a car crash. °SIGNS AND SYMPTOMS °The signs of a concussion can be hard to notice. Early on, they may be missed by you, family members, and health care providers. You may look fine but act or feel differently. °Symptoms are usually temporary, but they may last for days, weeks, or even longer. Some symptoms may appear right away while others may not show up for hours or days. Every head injury is different. Symptoms include: °· Mild to moderate headaches that will not go away. °· A feeling of pressure inside your head. °· Having more trouble than usual: °¨ Learning or remembering things you have heard. °¨ Answering questions. °¨ Paying attention or concentrating. °¨ Organizing daily tasks. °¨ Making decisions and solving problems. °· Slowness in thinking, acting or reacting, speaking, or reading. °· Getting lost or being easily confused. °· Feeling tired all the time or lacking energy (fatigued). °· Feeling drowsy. °· Sleep disturbances. °¨ Sleeping more than usual. °¨ Sleeping less than usual. °¨ Trouble falling asleep. °¨ Trouble sleeping (insomnia). °· Loss of balance or feeling lightheaded or dizzy. °· Nausea or vomiting. °· Numbness or tingling. °· Increased sensitivity to: °¨ Sounds. °¨ Lights. °¨ Distractions. °· Vision problems or eyes that tire  easily. °· Diminished sense of taste or smell. °· Ringing in the ears. °· Mood changes such as feeling sad or anxious. °· Becoming easily irritated or angry for little or no reason. °· Lack of motivation. °· Seeing or hearing things other people do not see or hear (hallucinations). °DIAGNOSIS °Your health care provider can usually diagnose a concussion based on a description of your injury and symptoms. He or she will ask whether you passed out (lost consciousness) and whether you are having trouble remembering events that happened right before and during your injury. °Your evaluation might include: °· A brain scan to look for signs of injury to the brain. Even if the test shows no injury, you may still have a concussion. °· Blood tests to be sure other problems are not present. °TREATMENT °· Concussions are usually treated in an emergency department, in urgent care, or at a clinic. You may need to stay in the hospital overnight for further treatment. °· Tell your health care provider if you are taking any medicines, including prescription medicines, over-the-counter medicines, and natural remedies. Some medicines, such as blood thinners (anticoagulants) and aspirin, may increase the chance of complications. Also tell your health care provider whether you have had alcohol or are taking illegal drugs. This information may affect treatment. °· Your health care provider will send you home with important instructions to follow. °· How fast you will recover from a concussion depends on many factors. These factors include how severe your concussion is, what part of your brain was injured, your   age, and how healthy you were before the concussion. °· Most people with mild injuries recover fully. Recovery can take time. In general, recovery is slower in older persons. Also, persons who have had a concussion in the past or have other medical problems may find that it takes longer to recover from their current injury. °HOME  CARE INSTRUCTIONS °General Instructions °· Carefully follow the directions your health care provider gave you. °· Only take over-the-counter or prescription medicines for pain, discomfort, or fever as directed by your health care provider. °· Take only those medicines that your health care provider has approved. °· Do not drink alcohol until your health care provider says you are well enough to do so. Alcohol and certain other drugs may slow your recovery and can put you at risk of further injury. °· If it is harder than usual to remember things, write them down. °· If you are easily distracted, try to do one thing at a time. For example, do not try to watch TV while fixing dinner. °· Talk with family members or close friends when making important decisions. °· Keep all follow-up appointments. Repeated evaluation of your symptoms is recommended for your recovery. °· Watch your symptoms and tell others to do the same. Complications sometimes occur after a concussion. Older adults with a brain injury may have a higher risk of serious complications, such as a blood clot on the brain. °· Tell your teachers, school nurse, school counselor, coach, athletic trainer, or work manager about your injury, symptoms, and restrictions. Tell them about what you can or cannot do. They should watch for: °¨ Increased problems with attention or concentration. °¨ Increased difficulty remembering or learning new information. °¨ Increased time needed to complete tasks or assignments. °¨ Increased irritability or decreased ability to cope with stress. °¨ Increased symptoms. °· Rest. Rest helps the brain to heal. Make sure you: °¨ Get plenty of sleep at night. Avoid staying up late at night. °¨ Keep the same bedtime hours on weekends and weekdays. °¨ Rest during the day. Take daytime naps or rest breaks when you feel tired. °· Limit activities that require a lot of thought or concentration. These include: °¨ Doing homework or job-related  work. °¨ Watching TV. °¨ Working on the computer. °· Avoid any situation where there is potential for another head injury (football, hockey, soccer, basketball, martial arts, downhill snow sports and horseback riding). Your condition will get worse every time you experience a concussion. You should avoid these activities until you are evaluated by the appropriate follow-up health care providers. °Returning To Your Regular Activities °You will need to return to your normal activities slowly, not all at once. You must give your body and brain enough time for recovery. °· Do not return to sports or other athletic activities until your health care provider tells you it is safe to do so. °· Ask your health care provider when you can drive, ride a bicycle, or operate heavy machinery. Your ability to react may be slower after a brain injury. Never do these activities if you are dizzy. °· Ask your health care provider about when you can return to work or school. °Preventing Another Concussion °It is very important to avoid another brain injury, especially before you have recovered. In rare cases, another injury can lead to permanent brain damage, brain swelling, or death. The risk of this is greatest during the first 7-10 days after a head injury. Avoid injuries by: °· Wearing a seat   belt when riding in a car. °· Drinking alcohol only in moderation. °· Wearing a helmet when biking, skiing, skateboarding, skating, or doing similar activities. °· Avoiding activities that could lead to a second concussion, such as contact or recreational sports, until your health care provider says it is okay. °· Taking safety measures in your home. °¨ Remove clutter and tripping hazards from floors and stairways. °¨ Use grab bars in bathrooms and handrails by stairs. °¨ Place non-slip mats on floors and in bathtubs. °¨ Improve lighting in dim areas. °SEEK MEDICAL CARE IF: °· You have increased problems paying attention or  concentrating. °· You have increased difficulty remembering or learning new information. °· You need more time to complete tasks or assignments than before. °· You have increased irritability or decreased ability to cope with stress. °· You have more symptoms than before. °Seek medical care if you have any of the following symptoms for more than 2 weeks after your injury: °· Lasting (chronic) headaches. °· Dizziness or balance problems. °· Nausea. °· Vision problems. °· Increased sensitivity to noise or light. °· Depression or mood swings. °· Anxiety or irritability. °· Memory problems. °· Difficulty concentrating or paying attention. °· Sleep problems. °· Feeling tired all the time. °SEEK IMMEDIATE MEDICAL CARE IF: °· You have severe or worsening headaches. These may be a sign of a blood clot in the brain. °· You have weakness (even if only in one hand, leg, or part of the face). °· You have numbness. °· You have decreased coordination. °· You vomit repeatedly. °· You have increased sleepiness. °· One pupil is larger than the other. °· You have convulsions. °· You have slurred speech. °· You have increased confusion. This may be a sign of a blood clot in the brain. °· You have increased restlessness, agitation, or irritability. °· You are unable to recognize people or places. °· You have neck pain. °· It is difficult to wake you up. °· You have unusual behavior changes. °· You lose consciousness. °MAKE SURE YOU: °· Understand these instructions. °· Will watch your condition. °· Will get help right away if you are not doing well or get worse. °Document Released: 08/20/2003 Document Revised: 06/04/2013 Document Reviewed: 12/20/2012 °[employer]® Patient Information ©2015 [employer]. This information is not intended to replace advice given to you by your health care provider. Make sure you discuss any questions you have with your health care provider. ° °Contusion °A contusion is a deep bruise. Contusions are the  result of an injury that caused bleeding under the skin. The contusion may turn blue, purple, or yellow. Minor injuries will give you a painless contusion, but more severe contusions may stay painful and swollen for a few weeks.  °CAUSES  °A contusion is usually caused by a blow, trauma, or direct force to an area of the body. °SYMPTOMS  °· Swelling and redness of the injured area. °· Bruising of the injured area. °· Tenderness and soreness of the injured area. °· Pain. °DIAGNOSIS  °The diagnosis can be made by taking a history and physical exam. An X-ray, CT scan, or MRI may be needed to determine if there were any associated injuries, such as fractures. °TREATMENT  °Specific treatment will depend on what area of the body was injured. In general, the best treatment for a contusion is resting, icing, elevating, and applying cold compresses to the injured area. Over-the-counter medicines may also be recommended for pain control. Ask your caregiver what the best treatment is   for your contusion. °HOME CARE INSTRUCTIONS  °· Put ice on the injured area. °¨ Put ice in a plastic bag. °¨ Place a towel between your skin and the bag. °¨ Leave the ice on for 15-20 minutes, 3-4 times a day, or as directed by your health care provider. °· Only take over-the-counter or prescription medicines for pain, discomfort, or fever as directed by your caregiver. Your caregiver may recommend avoiding anti-inflammatory medicines (aspirin, ibuprofen, and naproxen) for 48 hours because these medicines may increase bruising. °· Rest the injured area. °· If possible, elevate the injured area to reduce swelling. °SEEK IMMEDIATE MEDICAL CARE IF:  °· You have increased bruising or swelling. °· You have pain that is getting worse. °· Your swelling or pain is not relieved with medicines. °MAKE SURE YOU:  °· Understand these instructions. °· Will watch your condition. °· Will get help right away if you are not doing well or get worse. °Document  Released: 03/09/2005 Document Revised: 06/04/2013 Document Reviewed: 04/04/2011 °[employer]® Patient Information ©2015 [employer]. This information is not intended to replace advice given to you by your health care provider. Make sure you discuss any questions you have with your health care provider. ° °
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
## 2014-10-23 ENCOUNTER — Inpatient Hospital Stay (HOSPITAL_COMMUNITY): Payer: BLUE CROSS/BLUE SHIELD

## 2014-10-23 ENCOUNTER — Encounter (HOSPITAL_COMMUNITY): Payer: Self-pay | Admitting: *Deleted

## 2014-10-23 ENCOUNTER — Observation Stay (HOSPITAL_COMMUNITY)
Admission: AD | Admit: 2014-10-23 | Discharge: 2014-10-24 | Disposition: A | Discharge status: Home / Self Care | Payer: BLUE CROSS/BLUE SHIELD | Source: Ambulatory Visit | Attending: Obstetrics and Gynecology | Admitting: Obstetrics and Gynecology

## 2014-10-23 DIAGNOSIS — R109 Unspecified abdominal pain: Secondary | ICD-10-CM

## 2014-10-23 DIAGNOSIS — O9A219 Injury, poisoning and certain other consequences of external causes complicating pregnancy, unspecified trimester: Secondary | ICD-10-CM | POA: Insufficient documentation

## 2014-10-23 DIAGNOSIS — O26899 Other specified pregnancy related conditions, unspecified trimester: Secondary | ICD-10-CM | POA: Insufficient documentation

## 2014-10-23 DIAGNOSIS — Z3A35 35 weeks gestation of pregnancy: Secondary | ICD-10-CM | POA: Insufficient documentation

## 2014-10-23 DIAGNOSIS — R51 Headache: Secondary | ICD-10-CM | POA: Diagnosis not present

## 2014-10-23 DIAGNOSIS — O479 False labor, unspecified: Secondary | ICD-10-CM | POA: Diagnosis present

## 2014-10-23 DIAGNOSIS — O4700 False labor before 37 completed weeks of gestation, unspecified trimester: Secondary | ICD-10-CM | POA: Diagnosis present

## 2014-10-23 DIAGNOSIS — M545 Low back pain: Secondary | ICD-10-CM | POA: Insufficient documentation

## 2014-10-23 DIAGNOSIS — O9A213 Injury, poisoning and certain other consequences of external causes complicating pregnancy, third trimester: Principal | ICD-10-CM | POA: Insufficient documentation

## 2014-10-23 DIAGNOSIS — Z3689 Encounter for other specified antenatal screening: Secondary | ICD-10-CM | POA: Insufficient documentation

## 2014-10-23 DIAGNOSIS — Y92410 Unspecified street and highway as the place of occurrence of the external cause: Secondary | ICD-10-CM | POA: Diagnosis not present

## 2014-10-23 DIAGNOSIS — F329 Major depressive disorder, single episode, unspecified: Secondary | ICD-10-CM | POA: Diagnosis not present

## 2014-10-23 LAB — CBC
HEMATOCRIT: 31.4 % — AB (ref 36.0–46.0)
HEMOGLOBIN: 10.3 g/dL — AB (ref 12.0–15.0)
MCH: 26.6 pg (ref 26.0–34.0)
MCHC: 32.8 g/dL (ref 30.0–36.0)
MCV: 81.1 fL (ref 78.0–100.0)
Platelets: 139 10*3/uL — ABNORMAL LOW (ref 150–400)
RBC: 3.87 MIL/uL (ref 3.87–5.11)
RDW: 13.5 % (ref 11.5–15.5)
WBC: 11.8 10*3/uL — ABNORMAL HIGH (ref 4.0–10.5)

## 2014-10-23 LAB — URINALYSIS, ROUTINE W REFLEX MICROSCOPIC
Bilirubin Urine: NEGATIVE
GLUCOSE, UA: NEGATIVE mg/dL
Hgb urine dipstick: NEGATIVE
KETONES UR: NEGATIVE mg/dL
Nitrite: NEGATIVE
PROTEIN: NEGATIVE mg/dL
Specific Gravity, Urine: 1.025 (ref 1.005–1.030)
UROBILINOGEN UA: 0.2 mg/dL (ref 0.0–1.0)
pH: 6.5 (ref 5.0–8.0)

## 2014-10-23 LAB — URINE MICROSCOPIC-ADD ON

## 2014-10-23 LAB — COMPREHENSIVE METABOLIC PANEL
ALBUMIN: 2.9 g/dL — AB (ref 3.5–5.0)
ALT: 14 U/L (ref 14–54)
AST: 17 U/L (ref 15–41)
Alkaline Phosphatase: 120 U/L (ref 38–126)
Anion gap: 8 (ref 5–15)
BUN: 9 mg/dL (ref 6–20)
CHLORIDE: 104 mmol/L (ref 101–111)
CO2: 24 mmol/L (ref 22–32)
CREATININE: 0.55 mg/dL (ref 0.44–1.00)
Calcium: 8.9 mg/dL (ref 8.9–10.3)
GFR calc non Af Amer: >60 mL/min (ref >60)
Glucose, Bld: 89 mg/dL (ref 65–99)
Potassium: 3.9 mmol/L (ref 3.5–5.1)
Sodium: 136 mmol/L (ref 135–145)
TOTAL PROTEIN: 6.4 g/dL — AB (ref 6.5–8.1)
Total Bilirubin: 0.3 mg/dL (ref 0.3–1.2)

## 2014-10-23 MED ORDER — LACTATED RINGERS IV BOLUS (SEPSIS)
500.0000 mL | Freq: Once | INTRAVENOUS | Status: AC
Start: 2014-10-23 — End: 2014-10-23
  Administered 2014-10-23: 500 mL via INTRAVENOUS

## 2014-10-23 MED ORDER — LACTATED RINGERS IV SOLN: INTRAVENOUS | Status: AC
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
## 2014-10-24 DIAGNOSIS — R109 Unspecified abdominal pain: Secondary | ICD-10-CM

## 2014-10-24 DIAGNOSIS — O26899 Other specified pregnancy related conditions, unspecified trimester: Secondary | ICD-10-CM | POA: Insufficient documentation

## 2014-10-24 DIAGNOSIS — O9A219 Injury, poisoning and certain other consequences of external causes complicating pregnancy, unspecified trimester: Secondary | ICD-10-CM | POA: Insufficient documentation

## 2014-10-24 DIAGNOSIS — Z3A35 35 weeks gestation of pregnancy: Secondary | ICD-10-CM | POA: Insufficient documentation

## 2014-10-24 DIAGNOSIS — Z3689 Encounter for other specified antenatal screening: Secondary | ICD-10-CM | POA: Insufficient documentation

## 2014-10-24 DIAGNOSIS — O4700 False labor before 37 completed weeks of gestation, unspecified trimester: Secondary | ICD-10-CM | POA: Diagnosis present

## 2014-10-24 DIAGNOSIS — O479 False labor, unspecified: Secondary | ICD-10-CM | POA: Diagnosis present

## 2014-10-24 MED ORDER — LACTATED RINGERS IV SOLN: INTRAVENOUS | Status: DC

## 2014-10-24 MED ORDER — DOCUSATE SODIUM 100 MG PO CAPS: 100.0000 mg | ORAL_CAPSULE | Freq: Every day | ORAL | Status: DC

## 2014-10-24 MED ORDER — ACETAMINOPHEN 325 MG PO TABS: 650.0000 mg | ORAL_TABLET | ORAL | Status: DC | PRN

## 2014-10-24 MED ORDER — ZOLPIDEM TARTRATE 5 MG PO TABS: 5.0000 mg | ORAL_TABLET | Freq: Every evening | ORAL | Status: DC | PRN

## 2014-10-24 MED ORDER — PRENATAL MULTIVITAMIN CH: 1.0000 | ORAL_TABLET | Freq: Every day | ORAL | Status: DC

## 2014-10-24 MED ORDER — CALCIUM CARBONATE ANTACID 500 MG PO CHEW: 2.0000 | CHEWABLE_TABLET | ORAL | Status: DC | PRN

## 2014-10-24 NOTE — Progress Notes (Signed)
Pt comfortable.  Not feeling ctx.  No vb or lof.  Good FM  FHT reassuring, cat 1 overnight Toco occasional Cvx 1cm - unchanged  A/P:  Discharge home F/u in office next week

## 2014-10-24 NOTE — Discharge Instructions (Signed)
To follow-up next week with MD office- May 20th 11:20am. Instructed to call MD for pain, contractions, vaginal bleeding, decreased fetal movement. Verbalizes understanding.

## 2014-11-04 NOTE — H&P (Signed)
Lisa Bates is a 24 y.o. female presenting for repeat cesarean section following a vertical extension of previous uterine incision. History OB History    Gravida Para Term Preterm AB TAB SAB Ectopic Multiple Living   2 1 1       1      Past Medical History  Diagnosis Date  . Headache(784.0)   . Depression     not on meds, doing good   Past Surgical History  Procedure Laterality Date  . No past surgeries    . Cesarean section  07/04/2012    Procedure: CESAREAN SECTION;  Surgeon: Leslie AndreaJames E Natalee Tomkiewicz II, MD;  Location: [facility];  Service: Obstetrics;  Laterality: N/A;  Primary Cesarean Section Delivery Baby Girl @ 1840, Apgars 3/9    Family History: family history includes Cancer in her maternal grandmother; Depression in her maternal grandmother and mother; Diabetes in her paternal grandfather and paternal grandmother; Hypertension in her maternal grandmother. There is no history of Other. Social History:  reports that she has never smoked. She has never used smokeless tobacco. She reports that she does not drink alcohol or use illicit drugs.   Prenatal Transfer Tool  Maternal Diabetes: No Genetic Screening: Normal Maternal Ultrasounds/Referrals: Normal Fetal Ultrasounds or other Referrals:  None Maternal Substance Abuse:  No Significant Maternal Medications:  None Significant Maternal Lab Results:  None Other Comments:  None  Review of Systems  Eyes: Negative for blurred vision.  Gastrointestinal: Negative for abdominal pain.  Neurological: Negative for headaches.      Last menstrual period 02/22/2014, unknown if currently breastfeeding. Maternal Exam:  Abdomen: Patient reports no abdominal tenderness.   Physical Exam  Cardiovascular: Normal rate and regular rhythm.   Respiratory: Effort normal and breath sounds normal.  GI: Soft.  Neurological: She has normal reflexes.    Prenatal labs: ABO, Rh: B/Positive/-- (11/09 0000) Antibody: Negative (11/09 0000) Rubella:  Immune (11/09 0000) RPR: Nonreactive (11/09 0000)  HBsAg: Negative (11/09 0000)  HIV: Non-reactive (11/09 0000)  GBS:     Assessment/Plan: 24 yo G2P1 with history of vertical extension of uterine incision at cesarean section D/W patient risks including infection, organ damage, bleeding/transfusion-HIV/Hep, DVT/PE, pneumonia.   Meshach Perry II,Maciel Kegg E 11/04/2014, 6:38 PM

## 2014-11-06 ENCOUNTER — Encounter (HOSPITAL_COMMUNITY): Payer: Self-pay

## 2014-11-06 ENCOUNTER — Inpatient Hospital Stay (HOSPITAL_COMMUNITY)
Admission: AD | Admit: 2014-11-06 | Discharge: 2014-11-06 | Disposition: A | Discharge status: Home / Self Care | Payer: BLUE CROSS/BLUE SHIELD | Source: Ambulatory Visit | Attending: Obstetrics & Gynecology | Admitting: Obstetrics & Gynecology

## 2014-11-06 DIAGNOSIS — Z3A37 37 weeks gestation of pregnancy: Secondary | ICD-10-CM | POA: Diagnosis not present

## 2014-11-06 DIAGNOSIS — O9989 Other specified diseases and conditions complicating pregnancy, childbirth and the puerperium: Secondary | ICD-10-CM | POA: Diagnosis not present

## 2014-11-06 NOTE — Pre-Procedure Instructions (Signed)
Informed Stacy at OR desk that patient is 38 plus wks for C/S scheduled on 11/11/14.  Meets indications for scheduling C/S prior to 39 wks due to prior history of previous vertical extension at Cesarean Section.

## 2014-11-06 NOTE — MAU Note (Signed)
Started having contractions at 6 pm and sharp pains in back.  No leaking. No bleeding. Baby moving well. 1 cm at last visit.

## 2014-11-06 NOTE — Patient Instructions (Addendum)
   Your procedure is scheduled on:  Tuesday, May 31  Enter through the Main Entr[facility] of [facility] at: 12 Noon Pick up the phone at the desk and dial [phone] and inform us of your arrival.  Please call this number if you have any problems the morning of surgery: [phone]  Remember: Do not eat food after midnight: Monday Do not drink clear liquids after: 9:30 AM Tuesday, day of surgery Take these medicines the morning of surgery with a SIP OF WATER:  NONE Do not wear jewelry, make-up, or FINGER nail polish No metal in your hair or on your body. Do not wear lotions, powders, perfumes.  You may wear deodorant.  Do not bring valuables to the hospital. Contacts, dentures or bridgework may not be worn into surgery.  Leave suitcase in the car. After Surgery it may be brought to your room. For patients being admitted to the hospital, checkout time is 11:00am the day of discharge.  Home with husband Trey PaulaJeff cell [phone]

## 2014-11-06 NOTE — Discharge Instructions (Signed)
Third Trimester of Pregnancy °The third trimester is from week 29 through week 42, months 7 through 9. This trimester is when your unborn baby (fetus) is growing very fast. At the end of the ninth month, the unborn baby is about 20 inches in length. It weighs about 6-10 pounds.  °HOME CARE  °· Avoid all smoking, herbs, and alcohol. Avoid drugs not approved by your doctor. °· Only take medicine as told by your doctor. Some medicines are safe and some are not during pregnancy. °· Exercise only as told by your doctor. Stop exercising if you start having cramps. °· Eat regular, healthy meals. °· Wear a good support bra if your breasts are tender. °· Do not use hot tubs, steam rooms, or saunas. °· Wear your seat belt when driving. °· Avoid raw meat, uncooked cheese, and liter boxes and soil used by cats. °· Take your prenatal vitamins. °· Try taking medicine that helps you poop (stool softener) as needed, and if your doctor approves. Eat more fiber by eating fresh fruit, vegetables, and whole grains. Drink enough fluids to keep your pee (urine) clear or pale yellow. °· Take warm water baths (sitz baths) to soothe pain or discomfort caused by hemorrhoids. Use hemorrhoid cream if your doctor approves. °· If you have puffy, bulging veins (varicose veins), wear support hose. Raise (elevate) your feet for 15 minutes, 3-4 times a day. Limit salt in your diet. °· Avoid heavy lifting, wear low heels, and sit up straight. °· Rest with your legs raised if you have leg cramps or low back pain. °· Visit your dentist if you have not gone during your pregnancy. Use a soft toothbrush to brush your teeth. Be gentle when you floss. °· You can have sex (intercourse) unless your doctor tells you not to. °· Do not travel far distances unless you must. Only do so with your doctor's approval. °· Take prenatal classes. °· Practice driving to the hospital. °· Pack your hospital bag. °· Prepare the baby's room. °· Go to your doctor visits. °GET  HELP IF: °· You are not sure if you are in labor or if your water has broken. °· You are dizzy. °· You have mild cramps or pressure in your lower belly (abdominal). °· You have a nagging pain in your belly area. °· You continue to feel sick to your stomach (nauseous), throw up (vomit), or have watery poop (diarrhea). °· You have bad smelling fluid coming from your vagina. °· You have pain with peeing (urination). °GET HELP RIGHT AWAY IF:  °· You have a fever. °· You are leaking fluid from your vagina. °· You are spotting or bleeding from your vagina. °· You have severe belly cramping or pain. °· You lose or gain weight rapidly. °· You have trouble catching your breath and have chest pain. °· You notice sudden or extreme puffiness (swelling) of your face, hands, ankles, feet, or legs. °· You have not felt the baby move in over an hour. °· You have severe headaches that do not go away with medicine. °· You have vision changes. °Document Released: 08/24/2009 Document Revised: 09/24/2012 Document Reviewed: 07/31/2012 °ExitCare® Patient Information ©2015 [employer]. This information is not intended to replace advice given to you by your health care provider. Make sure you discuss any questions you have with your health care provider. °Fetal Movement Counts °Patient Name: __________________________________________________ Patient Due Date: ____________________ °Performing a fetal movement count is highly recommended in high-risk pregnancies, but it is good   for every pregnant woman to do. Your health care provider may ask you to start counting fetal movements at 28 weeks of the pregnancy. Fetal movements often increase:  After eating a full meal.  After physical activity.  After eating or drinking something sweet or cold.  At rest. Pay attention to when you feel the baby is most active. This will help you notice a pattern of your baby's sleep and wake cycles and what factors contribute to an increase in fetal  movement. It is important to perform a fetal movement count at the same time each day when your baby is normally most active.  HOW TO COUNT FETAL MOVEMENTS  Find a quiet and comfortable area to sit or lie down on your left side. Lying on your left side provides the best blood and oxygen circulation to your baby.  Write down the day and time on a sheet of paper or in a journal.  Start counting kicks, flutters, swishes, rolls, or jabs in a 2-hour period. You should feel at least 10 movements within 2 hours.  If you do not feel 10 movements in 2 hours, wait 2-3 hours and count again. Look for a change in the pattern or not enough counts in 2 hours. SEEK MEDICAL CARE IF:  You feel less than 10 counts in 2 hours, tried twice.  There is no movement in over an hour.  The pattern is changing or taking longer each day to reach 10 counts in 2 hours.  You feel the baby is not moving as he or she usually does. Date: ____________ Movements: ____________ Start time: ____________ Elizebeth Koller time: ____________  Date: ____________ Movements: ____________ Start time: ____________ Elizebeth Koller time: ____________ Date: ____________ Movements: ____________ Start time: ____________ Elizebeth Koller time: ____________ Date: ____________ Movements: ____________ Start time: ____________ Elizebeth Koller time: ____________ Date: ____________ Movements: ____________ Start time: ____________ Elizebeth Koller time: ____________ Date: ____________ Movements: ____________ Start time: ____________ Elizebeth Koller time: ____________ Date: ____________ Movements: ____________ Start time: ____________ Elizebeth Koller time: ____________ Date: ____________ Movements: ____________ Start time: ____________ Elizebeth Koller time: ____________  Date: ____________ Movements: ____________ Start time: ____________ Elizebeth Koller time: ____________ Date: ____________ Movements: ____________ Start time: ____________ Elizebeth Koller time: ____________ Date: ____________ Movements: ____________ Start time:  ____________ Elizebeth Koller time: ____________ Date: ____________ Movements: ____________ Start time: ____________ Elizebeth Koller time: ____________ Date: ____________ Movements: ____________ Start time: ____________ Elizebeth Koller time: ____________ Date: ____________ Movements: ____________ Start time: ____________ Elizebeth Koller time: ____________ Date: ____________ Movements: ____________ Start time: ____________ Elizebeth Koller time: ____________  Date: ____________ Movements: ____________ Start time: ____________ Elizebeth Koller time: ____________ Date: ____________ Movements: ____________ Start time: ____________ Elizebeth Koller time: ____________ Date: ____________ Movements: ____________ Start time: ____________ Elizebeth Koller time: ____________ Date: ____________ Movements: ____________ Start time: ____________ Elizebeth Koller time: ____________ Date: ____________ Movements: ____________ Start time: ____________ Elizebeth Koller time: ____________ Date: ____________ Movements: ____________ Start time: ____________ Elizebeth Koller time: ____________ Date: ____________ Movements: ____________ Start time: ____________ Elizebeth Koller time: ____________  Date: ____________ Movements: ____________ Start time: ____________ Elizebeth Koller time: ____________ Date: ____________ Movements: ____________ Start time: ____________ Elizebeth Koller time: ____________ Date: ____________ Movements: ____________ Start time: ____________ Elizebeth Koller time: ____________ Date: ____________ Movements: ____________ Start time: ____________ Elizebeth Koller time: ____________ Date: ____________ Movements: ____________ Start time: ____________ Elizebeth Koller time: ____________ Date: ____________ Movements: ____________ Start time: ____________ Elizebeth Koller time: ____________ Date: ____________ Movements: ____________ Start time: ____________ Elizebeth Koller time: ____________  Date: ____________ Movements: ____________ Start time: ____________ Elizebeth Koller time: ____________ Date: ____________ Movements: ____________ Start time: ____________ Elizebeth Koller time: ____________ Date:  ____________ Movements: ____________ Start time: ____________ Elizebeth Koller time: ____________ Date: ____________  Movements: ____________ Start time: ____________ Doreatha MartinFinish time: ____________ Date: ____________ Movements: ____________ Start time: ____________ Doreatha MartinFinish time: ____________ Date: ____________ Movements: ____________ Start time: ____________ Doreatha MartinFinish time: ____________ Date: ____________ Movements: ____________ Start time: ____________ Doreatha MartinFinish time: ____________  Date: ____________ Movements: ____________ Start time: ____________ Doreatha MartinFinish time: ____________ Date: ____________ Movements: ____________ Start time: ____________ Doreatha MartinFinish time: ____________ Date: ____________ Movements: ____________ Start time: ____________ Doreatha MartinFinish time: ____________ Date: ____________ Movements: ____________ Start time: ____________ Doreatha MartinFinish time: ____________ Date: ____________ Movements: ____________ Start time: ____________ Doreatha MartinFinish time: ____________ Date: ____________ Movements: ____________ Start time: ____________ Doreatha MartinFinish time: ____________ Date: ____________ Movements: ____________ Start time: ____________ Doreatha MartinFinish time: ____________  Date: ____________ Movements: ____________ Start time: ____________ Doreatha MartinFinish time: ____________ Date: ____________ Movements: ____________ Start time: ____________ Doreatha MartinFinish time: ____________ Date: ____________ Movements: ____________ Start time: ____________ Doreatha MartinFinish time: ____________ Date: ____________ Movements: ____________ Start time: ____________ Doreatha MartinFinish time: ____________ Date: ____________ Movements: ____________ Start time: ____________ Doreatha MartinFinish time: ____________ Date: ____________ Movements: ____________ Start time: ____________ Doreatha MartinFinish time: ____________ Date: ____________ Movements: ____________ Start time: ____________ Doreatha MartinFinish time: ____________  Date: ____________ Movements: ____________ Start time: ____________ Doreatha MartinFinish time: ____________ Date: ____________ Movements: ____________ Start  time: ____________ Doreatha MartinFinish time: ____________ Date: ____________ Movements: ____________ Start time: ____________ Doreatha MartinFinish time: ____________ Date: ____________ Movements: ____________ Start time: ____________ Doreatha MartinFinish time: ____________ Date: ____________ Movements: ____________ Start time: ____________ Doreatha MartinFinish time: ____________ Date: ____________ Movements: ____________ Start time: ____________ Doreatha MartinFinish time: ____________ Document Released: 06/29/2006 Document Revised: 10/14/2013 Document Reviewed: 03/26/2012 ExitCare Patient Information 2015 [employer]. This information is not intended to replace advice given to you by your health care provider. Make sure you discuss any questions you have with your health care provider. Braxton Hicks Contractions Contractions of the uterus can occur throughout pregnancy. Contractions are not always a sign that you are in labor.  WHAT ARE BRAXTON HICKS CONTRACTIONS?  Contractions that occur before labor are called Braxton Hicks contractions, or false labor. Toward the end of pregnancy (32-34 weeks), these contractions can develop more often and may become more forceful. This is not true labor because these contractions do not result in opening (dilatation) and thinning of the cervix. They are sometimes difficult to tell apart from true labor because these contractions can be forceful and people have different pain tolerances. You should not feel embarrassed if you go to the hospital with false labor. Sometimes, the only way to tell if you are in true labor is for your health care provider to look for changes in the cervix. If there are no prenatal problems or other health problems associated with the pregnancy, it is completely safe to be sent home with false labor and await the onset of true labor. HOW CAN YOU TELL THE DIFFERENCE BETWEEN TRUE AND FALSE LABOR? False Labor  The contractions of false labor are usually shorter and not as hard as those of true labor.    The contractions are usually irregular.   The contractions are often felt in the front of the lower abdomen and in the groin.   The contractions may go away when you walk around or change positions while lying down.   The contractions get weaker and are shorter lasting as time goes on.   The contractions do not usually become progressively stronger, regular, and closer together as with true labor.  True Labor  Contractions in true labor last 30-70 seconds, become very regular, usually become more intense, and increase in frequency.   The contractions do not go away with walking.   The discomfort  is usually felt in the top of the uterus and spreads to the lower abdomen and low back.   True labor can be determined by your health care provider with an exam. This will show that the cervix is dilating and getting thinner.  WHAT TO REMEMBER  Keep up with your usual exercises and follow other instructions given by your health care provider.   Take medicines as directed by your health care provider.   Keep your regular prenatal appointments.   Eat and drink lightly if you think you are going into labor.   If Braxton Hicks contractions are making you uncomfortable:   Change your position from lying down or resting to walking, or from walking to resting.   Sit and rest in a tub of warm water.   Drink 2-3 glasses of water. Dehydration may cause these contractions.   Do slow and deep breathing several times an hour.  WHEN SHOULD I SEEK IMMEDIATE MEDICAL CARE? Seek immediate medical care if:  Your contractions become stronger, more regular, and closer together.   You have fluid leaking or gushing from your vagina.   You have a fever.   You pass blood-tinged mucus.   You have vaginal bleeding.   You have continuous abdominal pain.   You have low back pain that you never had before.   You feel your baby's head pushing down and causing pelvic  pressure.   Your baby is not moving as much as it used to.  Document Released: 05/30/2005 Document Revised: 06/04/2013 Document Reviewed: 03/11/2013 [facility] LPExit[facility] Patient Information 2015 [employer] [place], [place]. This information is not intended to replace advice given to you by your health care provider. Make sure you discuss any questions you have with your health care provider.

## 2014-11-07 ENCOUNTER — Encounter (HOSPITAL_COMMUNITY)
Admission: RE | Admit: 2014-11-07 | Discharge: 2014-11-07 | Disposition: A | Discharge status: Home / Self Care | Payer: BLUE CROSS/BLUE SHIELD | Source: Ambulatory Visit | Attending: Obstetrics and Gynecology | Admitting: Obstetrics and Gynecology

## 2014-11-07 ENCOUNTER — Encounter (HOSPITAL_COMMUNITY): Payer: Self-pay

## 2014-11-07 DIAGNOSIS — Z01818 Encounter for other preprocedural examination: Secondary | ICD-10-CM | POA: Diagnosis not present

## 2014-11-07 LAB — CBC
HCT: 31.8 % — ABNORMAL LOW (ref 36.0–46.0)
Hemoglobin: 10.3 g/dL — ABNORMAL LOW (ref 12.0–15.0)
MCH: 25.8 pg — ABNORMAL LOW (ref 26.0–34.0)
MCHC: 32.4 g/dL (ref 30.0–36.0)
MCV: 79.5 fL (ref 78.0–100.0)
Platelets: 170 10*3/uL (ref 150–400)
RBC: 4 MIL/uL (ref 3.87–5.11)
RDW: 13.3 % (ref 11.5–15.5)
WBC: 11.3 10*3/uL — ABNORMAL HIGH (ref 4.0–10.5)

## 2014-11-07 LAB — ABO/RH: ABO/RH(D): B POS

## 2014-11-07 LAB — TYPE AND SCREEN
ABO/RH(D): B POS
ANTIBODY SCREEN: NEGATIVE

## 2014-11-08 LAB — RPR: RPR Ser Ql: NONREACTIVE

## 2014-11-11 ENCOUNTER — Inpatient Hospital Stay (HOSPITAL_COMMUNITY): Payer: BLUE CROSS/BLUE SHIELD | Admitting: Anesthesiology

## 2014-11-11 ENCOUNTER — Encounter (HOSPITAL_COMMUNITY)
Admission: RE | Disposition: A | Discharge status: Home / Self Care | Payer: Self-pay | Source: Ambulatory Visit | Attending: Obstetrics and Gynecology

## 2014-11-11 ENCOUNTER — Encounter (HOSPITAL_COMMUNITY): Payer: Self-pay | Admitting: Anesthesiology

## 2014-11-11 ENCOUNTER — Inpatient Hospital Stay (HOSPITAL_COMMUNITY)
Admission: RE | Admit: 2014-11-11 | Discharge: 2014-11-13 | DRG: 766 | Disposition: A | Discharge status: Home / Self Care | Payer: BLUE CROSS/BLUE SHIELD | Source: Ambulatory Visit | Attending: Obstetrics and Gynecology | Admitting: Obstetrics and Gynecology

## 2014-11-11 DIAGNOSIS — N858 Other specified noninflammatory disorders of uterus: Secondary | ICD-10-CM | POA: Diagnosis present

## 2014-11-11 DIAGNOSIS — O3421 Maternal care for scar from previous cesarean delivery: Secondary | ICD-10-CM | POA: Diagnosis present

## 2014-11-11 DIAGNOSIS — Z23 Encounter for immunization: Secondary | ICD-10-CM | POA: Diagnosis not present

## 2014-11-11 DIAGNOSIS — Z98891 History of uterine scar from previous surgery: Secondary | ICD-10-CM

## 2014-11-11 DIAGNOSIS — O34219 Maternal care for unspecified type scar from previous cesarean delivery: Secondary | ICD-10-CM

## 2014-11-11 DIAGNOSIS — Z3A38 38 weeks gestation of pregnancy: Secondary | ICD-10-CM | POA: Diagnosis present

## 2014-11-11 DIAGNOSIS — F329 Major depressive disorder, single episode, unspecified: Secondary | ICD-10-CM | POA: Diagnosis present

## 2014-11-11 DIAGNOSIS — O99344 Other mental disorders complicating childbirth: Secondary | ICD-10-CM | POA: Diagnosis present

## 2014-11-11 LAB — TYPE AND SCREEN
ABO/RH(D): B POS
Antibody Screen: NEGATIVE

## 2014-11-11 SURGERY — Surgical Case
Anesthesia: Spinal

## 2014-11-11 MED ORDER — ZOLPIDEM TARTRATE 5 MG PO TABS: 5.0000 mg | ORAL_TABLET | Freq: Every evening | ORAL | Status: DC | PRN

## 2014-11-11 MED ORDER — BUPIVACAINE IN DEXTROSE 0.75-8.25 % IT SOLN
INTRATHECAL | Status: DC | PRN
  Administered 2014-11-11: 1.4 mL via INTRATHECAL

## 2014-11-11 MED ORDER — TETANUS-DIPHTH-ACELL PERTUSSIS 5-2.5-18.5 LF-MCG/0.5 IM SUSP
0.5000 mL | Freq: Once | INTRAMUSCULAR | Status: AC
Start: 2014-11-12 — End: 2014-11-12
  Administered 2014-11-12: 0.5 mL via INTRAMUSCULAR
  Filled 2014-11-11: qty 0.5

## 2014-11-11 MED ORDER — DIPHENHYDRAMINE HCL 50 MG/ML IJ SOLN: 12.5000 mg | INTRAMUSCULAR | Status: DC | PRN

## 2014-11-11 MED ORDER — ACETAMINOPHEN 325 MG PO TABS: 650.0000 mg | ORAL_TABLET | ORAL | Status: DC | PRN

## 2014-11-11 MED ORDER — IBUPROFEN 600 MG PO TABS: 600.0000 mg | ORAL_TABLET | Freq: Four times a day (QID) | ORAL | Status: DC | PRN

## 2014-11-11 MED ORDER — OXYTOCIN 40 UNITS IN LACTATED RINGERS INFUSION - SIMPLE MED: 62.5000 mL/h | INTRAVENOUS | Status: AC

## 2014-11-11 MED ORDER — SODIUM CHLORIDE 0.9 % IJ SOLN: 3.0000 mL | INTRAMUSCULAR | Status: DC | PRN

## 2014-11-11 MED ORDER — LACTATED RINGERS IV SOLN: INTRAVENOUS | Status: DC

## 2014-11-11 MED ORDER — ONDANSETRON HCL 4 MG/2ML IJ SOLN: 4.0000 mg | Freq: Three times a day (TID) | INTRAMUSCULAR | Status: DC | PRN

## 2014-11-11 MED ORDER — SIMETHICONE 80 MG PO CHEW: 80.0000 mg | CHEWABLE_TABLET | ORAL | Status: DC | PRN

## 2014-11-11 MED ORDER — NALBUPHINE HCL 10 MG/ML IJ SOLN: 5.0000 mg | Freq: Once | INTRAMUSCULAR | Status: AC | PRN

## 2014-11-11 MED ORDER — SCOPOLAMINE 1 MG/3DAYS TD PT72
1.0000 | MEDICATED_PATCH | Freq: Once | TRANSDERMAL | Status: DC
  Administered 2014-11-11: 1.5 mg via TRANSDERMAL

## 2014-11-11 MED ORDER — NALOXONE HCL 1 MG/ML IJ SOLN
1.0000 ug/kg/h | INTRAVENOUS | Status: DC | PRN
  Filled 2014-11-11: qty 2

## 2014-11-11 MED ORDER — MEPERIDINE HCL 25 MG/ML IJ SOLN: 6.2500 mg | INTRAMUSCULAR | Status: DC | PRN

## 2014-11-11 MED ORDER — SCOPOLAMINE 1 MG/3DAYS TD PT72
MEDICATED_PATCH | TRANSDERMAL | Status: AC
  Administered 2014-11-11: 1.5 mg via TRANSDERMAL
  Filled 2014-11-11: qty 1

## 2014-11-11 MED ORDER — KETOROLAC TROMETHAMINE 30 MG/ML IJ SOLN: 30.0000 mg | Freq: Four times a day (QID) | INTRAMUSCULAR | Status: AC | PRN

## 2014-11-11 MED ORDER — CEFAZOLIN SODIUM-DEXTROSE 2-3 GM-% IV SOLR
2.0000 g | INTRAVENOUS | Status: AC
  Administered 2014-11-11: 2 g via INTRAVENOUS

## 2014-11-11 MED ORDER — MORPHINE SULFATE (PF) 0.5 MG/ML IJ SOLN
INTRAMUSCULAR | Status: DC | PRN
  Administered 2014-11-11: .15 mg via INTRATHECAL

## 2014-11-11 MED ORDER — LACTATED RINGERS IV SOLN
Freq: Once | INTRAVENOUS | Status: AC
  Administered 2014-11-11: 14:00:00 via INTRAVENOUS

## 2014-11-11 MED ORDER — NALBUPHINE HCL 10 MG/ML IJ SOLN
5.0000 mg | INTRAMUSCULAR | Status: DC | PRN
  Filled 2014-11-11: qty 1

## 2014-11-11 MED ORDER — FENTANYL CITRATE (PF) 100 MCG/2ML IJ SOLN
INTRAMUSCULAR | Status: DC | PRN
  Administered 2014-11-11: 25 ug via INTRATHECAL

## 2014-11-11 MED ORDER — DIPHENHYDRAMINE HCL 25 MG PO CAPS: 25.0000 mg | ORAL_CAPSULE | Freq: Four times a day (QID) | ORAL | Status: DC | PRN

## 2014-11-11 MED ORDER — DIBUCAINE 1 % RE OINT: 1.0000 "application " | TOPICAL_OINTMENT | RECTAL | Status: DC | PRN

## 2014-11-11 MED ORDER — PHENYLEPHRINE 8 MG IN D5W 100 ML (0.08MG/ML) PREMIX OPTIME
INJECTION | INTRAVENOUS | Status: DC | PRN
  Administered 2014-11-11: 60 ug/min via INTRAVENOUS

## 2014-11-11 MED ORDER — ONDANSETRON HCL 4 MG/2ML IJ SOLN
INTRAMUSCULAR | Status: DC | PRN
  Administered 2014-11-11: 4 mg via INTRAVENOUS

## 2014-11-11 MED ORDER — LACTATED RINGERS IV SOLN
INTRAVENOUS | Status: DC
  Administered 2014-11-11 (×3): via INTRAVENOUS

## 2014-11-11 MED ORDER — OXYCODONE-ACETAMINOPHEN 5-325 MG PO TABS
2.0000 | ORAL_TABLET | ORAL | Status: DC | PRN
  Administered 2014-11-12: 2 via ORAL
  Filled 2014-11-11: qty 2

## 2014-11-11 MED ORDER — NALOXONE HCL 0.4 MG/ML IJ SOLN: 0.4000 mg | INTRAMUSCULAR | Status: DC | PRN

## 2014-11-11 MED ORDER — DIPHENHYDRAMINE HCL 25 MG PO CAPS
25.0000 mg | ORAL_CAPSULE | ORAL | Status: DC | PRN
  Filled 2014-11-11: qty 1

## 2014-11-11 MED ORDER — LANOLIN HYDROUS EX OINT: 1.0000 "application " | TOPICAL_OINTMENT | CUTANEOUS | Status: DC | PRN

## 2014-11-11 MED ORDER — PRENATAL MULTIVITAMIN CH
1.0000 | ORAL_TABLET | Freq: Every day | ORAL | Status: DC
  Administered 2014-11-12: 1 via ORAL
  Filled 2014-11-11: qty 1

## 2014-11-11 MED ORDER — NALBUPHINE HCL 10 MG/ML IJ SOLN
5.0000 mg | INTRAMUSCULAR | Status: DC | PRN
  Administered 2014-11-12: 5 mg via SUBCUTANEOUS

## 2014-11-11 MED ORDER — OXYCODONE-ACETAMINOPHEN 5-325 MG PO TABS
1.0000 | ORAL_TABLET | ORAL | Status: DC | PRN
  Administered 2014-11-12: 1 via ORAL
  Filled 2014-11-11: qty 1

## 2014-11-11 MED ORDER — SIMETHICONE 80 MG PO CHEW
80.0000 mg | CHEWABLE_TABLET | Freq: Three times a day (TID) | ORAL | Status: DC
  Administered 2014-11-12 (×3): 80 mg via ORAL
  Filled 2014-11-11 (×4): qty 1

## 2014-11-11 MED ORDER — SIMETHICONE 80 MG PO CHEW
80.0000 mg | CHEWABLE_TABLET | ORAL | Status: DC
  Administered 2014-11-12 (×2): 80 mg via ORAL
  Filled 2014-11-11 (×2): qty 1

## 2014-11-11 MED ORDER — LACTATED RINGERS IV SOLN
INTRAVENOUS | Status: DC | PRN
  Administered 2014-11-11: 14:00:00 via INTRAVENOUS

## 2014-11-11 MED ORDER — MENTHOL 3 MG MT LOZG: 1.0000 | LOZENGE | OROMUCOSAL | Status: DC | PRN

## 2014-11-11 MED ORDER — SENNOSIDES-DOCUSATE SODIUM 8.6-50 MG PO TABS
2.0000 | ORAL_TABLET | ORAL | Status: DC
  Administered 2014-11-12 (×2): 2 via ORAL
  Filled 2014-11-11 (×2): qty 2

## 2014-11-11 MED ORDER — BUPIVACAINE LIPOSOME 1.3 % IJ SUSP
20.0000 mL | Freq: Once | INTRAMUSCULAR | Status: AC
  Administered 2014-11-11: 20 mL
  Filled 2014-11-11: qty 20

## 2014-11-11 MED ORDER — FENTANYL CITRATE (PF) 100 MCG/2ML IJ SOLN: 25.0000 ug | INTRAMUSCULAR | Status: DC | PRN

## 2014-11-11 MED ORDER — SODIUM CHLORIDE 0.9 % IJ SOLN
INTRAMUSCULAR | Status: DC | PRN
  Administered 2014-11-11: 10 mL via INTRAVENOUS

## 2014-11-11 MED ORDER — CEFAZOLIN SODIUM-DEXTROSE 2-3 GM-% IV SOLR
INTRAVENOUS | Status: AC
Start: 2014-11-11 — End: 2014-11-12
  Filled 2014-11-11: qty 50

## 2014-11-11 MED ORDER — OXYTOCIN 10 UNIT/ML IJ SOLN
40.0000 [IU] | INTRAVENOUS | Status: DC | PRN
  Administered 2014-11-11: 40 [IU] via INTRAVENOUS

## 2014-11-11 MED ORDER — WITCH HAZEL-GLYCERIN EX PADS: 1.0000 "application " | MEDICATED_PAD | CUTANEOUS | Status: DC | PRN

## 2014-11-11 MED ORDER — IBUPROFEN 600 MG PO TABS
600.0000 mg | ORAL_TABLET | Freq: Four times a day (QID) | ORAL | Status: DC
  Administered 2014-11-11 – 2014-11-13 (×7): 600 mg via ORAL
  Filled 2014-11-11 (×8): qty 1

## 2014-11-11 MED ORDER — SODIUM CHLORIDE 0.9 % IJ SOLN
INTRAMUSCULAR | Status: AC
  Filled 2014-11-11: qty 10

## 2014-11-11 SURGICAL SUPPLY — 37 items
APL SKNCLS STERI-STRIP NONHPOA (GAUZE/BANDAGES/DRESSINGS) ×1
BENZOIN TINCTURE PRP APPL 2/3 (GAUZE/BANDAGES/DRESSINGS) ×2 IMPLANT
CLAMP CORD UMBIL (MISCELLANEOUS) IMPLANT
CLOSURE WOUND 1/2 X4 (GAUZE/BANDAGES/DRESSINGS) ×1
CLOTH BEACON ORANGE TIMEOUT ST (SAFETY) ×3 IMPLANT
CONTAINER PREFILL 10% NBF 15ML (MISCELLANEOUS) IMPLANT
DRAPE SHEET LG 3/4 BI-LAMINATE (DRAPES) IMPLANT
DRSG OPSITE POSTOP 4X10 (GAUZE/BANDAGES/DRESSINGS) ×3 IMPLANT
DURAPREP 26ML APPLICATOR (WOUND CARE) ×3 IMPLANT
ELECT REM PT RETURN 9FT ADLT (ELECTROSURGICAL) ×3
ELECTRODE REM PT RTRN 9FT ADLT (ELECTROSURGICAL) ×1 IMPLANT
EXTRACTOR VACUUM M CUP 4 TUBE (SUCTIONS) IMPLANT
EXTRACTOR VACUUM M CUP 4' TUBE (SUCTIONS)
GLOVE BIO SURGEON STRL SZ7.5 (GLOVE) ×3 IMPLANT
GOWN STRL REUS W/TWL LRG LVL3 (GOWN DISPOSABLE) ×10 IMPLANT
KIT ABG SYR 3ML LUER SLIP (SYRINGE) ×3 IMPLANT
LIQUID BAND (GAUZE/BANDAGES/DRESSINGS) IMPLANT
NDL HYPO 21X1.5 SAFETY (NEEDLE) ×1 IMPLANT
NDL HYPO 25X5/8 SAFETYGLIDE (NEEDLE) ×1 IMPLANT
NEEDLE HYPO 21X1.5 SAFETY (NEEDLE) ×3 IMPLANT
NEEDLE HYPO 25X5/8 SAFETYGLIDE (NEEDLE) ×3 IMPLANT
NS IRRIG 1000ML POUR BTL (IV SOLUTION) ×3 IMPLANT
PACK C SECTION WH (CUSTOM PROCEDURE TRAY) ×3 IMPLANT
PAD OB MATERNITY 4.3X12.25 (PERSONAL CARE ITEMS) ×3 IMPLANT
STAPLER VISISTAT 35W (STAPLE) IMPLANT
STRIP CLOSURE SKIN 1/2X4 (GAUZE/BANDAGES/DRESSINGS) ×1 IMPLANT
SUT MNCRL 0 VIOLET CTX 36 (SUTURE) ×4 IMPLANT
SUT MONOCRYL 0 CTX 36 (SUTURE) ×8
SUT PDS AB 0 CTX 60 (SUTURE) ×3 IMPLANT
SUT PLAIN 0 NONE (SUTURE) IMPLANT
SUT PLAIN 2 0 (SUTURE)
SUT PLAIN 2 0 XLH (SUTURE) IMPLANT
SUT PLAIN ABS 2-0 CT1 27XMFL (SUTURE) IMPLANT
SUT VIC AB 4-0 KS 27 (SUTURE) ×3 IMPLANT
SYR 30ML LL (SYRINGE) ×3 IMPLANT
TOWEL OR 17X24 6PK STRL BLUE (TOWEL DISPOSABLE) ×3 IMPLANT
TRAY FOLEY CATH SILVER 14FR (SET/KITS/TRAYS/PACK) ×3 IMPLANT

## 2014-11-11 NOTE — Anesthesia Procedure Notes (Signed)
Spinal Patient location during procedure: OR Start time: 11/11/2014 1:42 PM Staffing Anesthesiologist: Mal AmabileFOSTER, Lavana Huckeba Performed by: anesthesiologist  Preanesthetic Checklist Completed: patient identified, site marked, surgical consent, pre-op evaluation, timeout performed, IV checked, risks and benefits discussed and monitors and equipment checked Spinal Block Patient position: sitting Prep: site prepped and draped and DuraPrep Patient monitoring: heart rate, cardiac monitor, continuous pulse ox and blood pressure Approach: midline Location: L3-4 Injection technique: single-shot Needle Needle type: Sprotte  Needle gauge: 24 G Needle length: 9 cm Needle insertion depth: 5 cm Assessment Sensory level: T4 Additional Notes Patient tolerated procedure well. Adequate sensory level.

## 2014-11-11 NOTE — Transfer of Care (Signed)
Immediate Anesthesia Transfer of Care Note  Patient: Lisa Bates  Procedure(s) Performed: Procedure(s): REPEAT CESAREAN SECTION (N/A)  Patient Location: PACU  Anesthesia Type:Spinal  Level of Consciousness: awake, alert , oriented and patient cooperative  Airway & Oxygen Therapy: Patient Spontanous Breathing  Post-op Assessment: Report given to RN and Post -op Vital signs reviewed and stable  Post vital signs: Reviewed and stable BP104/[id number]%  Last Vitals:  Filed Vitals:   11/11/14 1311  BP: 111/68  Pulse: 112  Temp: 36.8 C  Resp: 16    Complications: No apparent anesthesia complications

## 2014-11-11 NOTE — Anesthesia Preprocedure Evaluation (Signed)
Anesthesia Evaluation  Patient identified by MRN, date of birth, ID band Patient awake    Reviewed: Allergy & Precautions, NPO status , Patient's Chart, lab work & pertinent test results  Airway Mallampati: II  TM Distance: >3 FB Neck ROM: Full    Dental no notable dental hx. (+) Teeth Intact   Pulmonary neg pulmonary ROS,  breath sounds clear to auscultation  Pulmonary exam normal       Cardiovascular negative cardio ROS Normal cardiovascular examRhythm:Regular Rate:Normal     Neuro/Psych  Headaches, PSYCHIATRIC DISORDERS Depression    GI/Hepatic Neg liver ROS, GERD-  ,  Endo/Other  Obesity  Renal/GU negative Renal ROS  negative genitourinary   Musculoskeletal negative musculoskeletal ROS (+)   Abdominal (+) + obese,   Peds  Hematology  (+) anemia ,   Anesthesia Other Findings   Reproductive/Obstetrics (+) Pregnancy Previous C/Section w/ vertical incision                             Anesthesia Physical Anesthesia Plan  ASA: II  Anesthesia Plan: Spinal   Post-op Pain Management:    Induction:   Airway Management Planned: Natural Airway  Additional Equipment:   Intra-op Plan:   Post-operative Plan:   Informed Consent: I have reviewed the patients History and Physical, chart, labs and discussed the procedure including the risks, benefits and alternatives for the proposed anesthesia with the patient or authorized representative who has indicated his/her understanding and acceptance.     Plan Discussed with: Anesthesiologist  Anesthesia Plan Comments:         Anesthesia Quick Evaluation

## 2014-11-11 NOTE — Progress Notes (Signed)
No changes to H&P per patient history Reviewed with patient procedure-repeat cesarean section  All questions answered Patient states she understands and agrees with plan

## 2014-11-11 NOTE — Brief Op Note (Signed)
11/11/2014  2:28 PM  PATIENT:  Lisa MarchAshley K Bates  24 y.o. female  PRE-OPERATIVE DIAGNOSIS:  PREVIOUS VERTICAL INCISION - 38 WEEKS  POST-OPERATIVE DIAGNOSIS:  PREVIOUS VERTICAL INCISION - 38 WEEKS  PROCEDURE:  Procedure(s): REPEAT CESAREAN SECTION (N/A)  SURGEON:  Surgeon(s) and Role:    * Harold HedgeJames Sanela Evola, MD - Primary    * Marcelle OverlieMichelle Grewal, MD - Assisting  PHYSICIAN ASSISTANT:   ASSISTANTS: none   ANESTHESIA:   spinal  EBL:  Total I/O In: 2300 [I.V.:2300] Out: 700 [Urine:100; Blood:600]  BLOOD ADMINISTERED:none  DRAINS: Urinary Catheter (Foley)   LOCAL MEDICATIONS USED:  OTHER exparel  SPECIMEN:  No Specimen  DISPOSITION OF SPECIMEN:  N/A  COUNTS:  YES  TOURNIQUET:  * No tourniquets in log *  DICTATION: .Other Dictation: Dictation Number [id number]  PLAN OF CARE: Admit to inpatient   PATIENT DISPOSITION:  PACU - hemodynamically stable.   Delay start of Pharmacological VTE agent (>24hrs) due to surgical blood loss or risk of bleeding: not applicable

## 2014-11-11 NOTE — Anesthesia Postprocedure Evaluation (Signed)
  Anesthesia Post-op Note  Patient: Lisa Bates  Procedure(s) Performed: Procedure(s): REPEAT CESAREAN SECTION (N/A)  Patient Location: PACU  Anesthesia Type:Spinal  Level of Consciousness: awake, alert  and oriented  Airway and Oxygen Therapy: Patient Spontanous Breathing  Post-op Pain: none  Post-op Assessment: Post-op Vital signs reviewed, Patient's Cardiovascular Status Stable, Respiratory Function Stable, Patent Airway, No signs of Nausea or vomiting, Pain level controlled, No headache and No backache  Post-op Vital Signs: Reviewed and stable  Last Vitals:  Filed Vitals:   11/11/14 1545  BP: 103/52  Pulse: 74  Temp: 36.5 C  Resp: 18    Complications: No apparent anesthesia complications

## 2014-11-11 NOTE — Consult Note (Signed)
Neonatology Note:   Attendance at C-section:    I was asked by Dr. Henderson Cloudomblin to attend this repeat C/S at term. The mother is a G2P1 B pos, GBS neg with an uncomplicated pregnancy. ROM at delivery, fluid clear. Infant vigorous with good spontaneous cry and tone. Needed only minimal bulb suctioning. Ap 9/9. Lungs clear to ausc in DR. To CN to care of Pediatrician.   Lisa Souhristie C. Welcome Fults, MD

## 2014-11-11 NOTE — Progress Notes (Signed)
MOB was referred for history of depression/anxiety.  Referral is screened out by Clinical Social Worker because none of the following criteria appear to apply: -History of anxiety/depression during this pregnancy, or of post-partum depression. - Diagnosis of anxiety and/or depression within last 3 years or -MOB's symptoms are currently being treated with medication and/or therapy.  Per chart review, no symptoms of depression since high school. MOB denied history of PPD.  Please contact the Clinical Social Worker if needs arise or upon MOB request.  

## 2014-11-12 ENCOUNTER — Encounter (HOSPITAL_COMMUNITY): Payer: Self-pay | Admitting: Obstetrics and Gynecology

## 2014-11-12 LAB — CBC
HEMATOCRIT: 30.5 % — AB (ref 36.0–46.0)
Hemoglobin: 9.9 g/dL — ABNORMAL LOW (ref 12.0–15.0)
MCH: 25.6 pg — ABNORMAL LOW (ref 26.0–34.0)
MCHC: 32.5 g/dL (ref 30.0–36.0)
MCV: 79 fL (ref 78.0–100.0)
Platelets: 135 10*3/uL — ABNORMAL LOW (ref 150–400)
RBC: 3.86 MIL/uL — AB (ref 3.87–5.11)
RDW: 13.5 % (ref 11.5–15.5)
WBC: 14.4 10*3/uL — ABNORMAL HIGH (ref 4.0–10.5)

## 2014-11-12 LAB — BIRTH TISSUE RECOVERY COLLECTION (PLACENTA DONATION)

## 2014-11-12 NOTE — Op Note (Signed)
NAMEELIZABTH, Lisa Bates NO.:  [id number]  MEDICAL RECORD NO.:  [id number]  LOCATION:  9127                          FACILITY:  [facility]  PHYSICIAN:  Guy Sandifer. Henderson Cloud, M.D. DATE OF BIRTH:  1990/11/13  DATE OF PROCEDURE:  11/11/2014 DATE OF DISCHARGE:                              OPERATIVE REPORT   PREOPERATIVE DIAGNOSES: 1. Intrauterine pregnancy at 5 weeks' gestational age. 2. Previous vertical cesarean section.  POSTOPERATIVE DIAGNOSES: 1. Intrauterine gestation at 77 weeks' estimated gestational age. 2. Previous vertical cesarean section.  SURGEON:  Guy Sandifer. Henderson Cloud, M.D.  ASSISTANTStann Mainland. Vincente Poli, M.D.  ANESTHESIA:  Spinal, Angelica Pou, M.D.  ESTIMATED BLOOD LOSS:  500 mL.  FINDINGS:  Viable female infant.  Apgars, arterial cord pH, weight pending.  SPECIMENS:  None.  INDICATIONS AND CONSENT:  This patient is a 24 year old G2, P1 at 38 weeks.  Previous cesarean section had a vertical extension.  Repeat cesarean section was recommended.  Potential risks and complications were reviewed preoperatively including, but not limited to, infection, organ damage, bleeding requiring transfusion of blood products with HIV and hepatitis acquisition, DVT, PE, pneumonia, returned to the operating room.  All questions were answered.  The patient states she understands and agrees.  Consent was signed on the chart.  DESCRIPTION OF PROCEDURE:  The patient was taken to the operating room where she was identified, spinal anesthetic was placed per Dr. Malen Gauze. She was placed in a dorsal supine position with a 15-degree left lateral wedge.  Foley catheter was placed and she was prepped and draped per [facility] protocol.  Time-out was undertaken.  After testing for adequate spinal anesthesia, skin was entered through a Pfannenstiel incision back through the previous scar.  Dissection was carried out in layers of the peritoneum.  Peritoneum was taken down and  extended superiorly and inferiorly.  Vesicouterine peritoneum was taken down cephalad laterally.  The bladder flap was developed and the bladder blade was placed.  Uterus was incised in a low transverse manner and the uterine cavity was entered bluntly with a hemostat.  Clear fluid was noted.  Uterine incision was extended cephalad laterally with fingers. Baby was then delivered from the vertex position without difficulty. Good cry and tone was noted.  Cord was clamped and cut and the baby was handed to awaiting Pediatrics team.  Placenta was manually delivered. Cavity was clean.  Uterus was closed in 2 running locking imbricating layers of 0 Monocryl suture which achieves good hemostasis.  Anterior peritoneum was closed in a running fashion with 0 Monocryl suture which was also used to reapproximate the pyramidalis muscle in midline. Anterior rectus fascia was closed in running fashion with a 0 looped PDS suture.  Exparel diluted in saline was then injected both subfascially and subcutaneously.  Subcutaneous tissue was then closed with interrupted plain suture and the skin was closed in a subcuticular fashion with Vicryl and a Keith needle.  Benzoin and Steri-Strips were applied.  All counts were correct.  The patient was taken to the recovery room in stable condition.     Guy Sandifer Henderson Cloud, M.D.     JET/MEDQ  D:  11/11/2014  T:  11/12/2014  Job:  [id number]

## 2014-11-12 NOTE — Addendum Note (Signed)
Addendum  created 11/12/14 [id number] by Earmon PhoenixValerie P Thurza Kwiecinski, CRNA   Modules edited: Notes Section   Notes Section:  File: [id number]

## 2014-11-12 NOTE — Anesthesia Postprocedure Evaluation (Signed)
  Anesthesia Post-op Note  Patient: Lisa Bates  Procedure(s) Performed: Procedure(s): REPEAT CESAREAN SECTION (N/A)  Patient Location: Mother/Baby  Anesthesia Type:Spinal  Level of Consciousness: awake, alert , oriented and patient cooperative  Airway and Oxygen Therapy: Patient Spontanous Breathing  Post-op Pain: mild  Post-op Assessment: Patient's Cardiovascular Status Stable, Respiratory Function Stable, No headache, No backache, No residual numbness and No residual motor weakness  Post-op Vital Signs: stable  Last Vitals:  Filed Vitals:   11/12/14 0743  BP: 106/61  Pulse: 65  Temp: 36.4 C  Resp: 18    Complications: No apparent anesthesia complications

## 2014-11-12 NOTE — Progress Notes (Signed)
Subjective: Postpartum Day 1: Cesarean Delivery Patient reports tolerating PO and no problems voiding.    Objective: Vital signs in last 24 hours: Temp:  [97.5 F (36.4 C)-99 F (37.2 C)] 97.5 F (36.4 C) (06/01 0743) Pulse Rate:  [65-112] 65 (06/01 0743) Resp:  [16-24] 18 (06/01 0743) BP: (93-113)/(47-77) 106/61 mmHg (06/01 0743) SpO2:  [96 %-100 %] 98 % (06/01 0743)  Physical Exam:  General: alert and cooperative Lochia: appropriate Uterine Fundus: firm Incision: healing well DVT Evaluation: No evidence of DVT seen on physical exam. Negative Homan's sign. No cords or calf tenderness. No significant calf/ankle edema.   Recent Labs  11/12/14 0620  HGB 9.9*  HCT 30.5*    Assessment/Plan: Status post Cesarean section. Doing well postoperatively.  Continue current care.  CURTIS,CAROL G 11/12/2014, 8:15 AM

## 2014-11-13 DIAGNOSIS — Z98891 History of uterine scar from previous surgery: Secondary | ICD-10-CM

## 2014-11-13 MED ORDER — IBUPROFEN 600 MG PO TABS: 600.0000 mg | ORAL_TABLET | Freq: Four times a day (QID) | ORAL | Status: DC | PRN

## 2014-11-13 MED ORDER — OXYCODONE-ACETAMINOPHEN 5-325 MG PO TABS: 1.0000 | ORAL_TABLET | Freq: Four times a day (QID) | ORAL | Status: DC | PRN

## 2014-11-13 NOTE — Discharge Summary (Signed)
Obstetric Discharge Summary Reason for Admission: cesarean section Prenatal Procedures: none Intrapartum Procedures: cesarean: low cervical, transverse Postpartum Procedures: none Complications-Operative and Postpartum: none HEMOGLOBIN  Date Value Ref Range Status  11/12/2014 9.9* 12.0 - 15.0 g/dL Final   HCT  Date Value Ref Range Status  11/12/2014 30.5* 36.0 - 46.0 % Final    Physical Exam:  General: alert, cooperative and no distress Lochia: appropriate Uterine Fundus: firm Incision: healing well DVT Evaluation: No evidence of DVT seen on physical exam.  Discharge Diagnoses: Term Pregnancy-delivered  Discharge Information: Date: 11/13/2014 Activity: pelvic rest Diet: routine Medications: PNV, Ibuprofen and Percocet Condition: stable Instructions: refer to practice specific booklet Discharge to: home Follow-up Information    Follow up In 2 weeks.      Newborn Data: Live born female  Birth Weight: 7 lb 15.7 oz (3620 g) APGAR: 9, 9  Home with mother.  Joffrey Kerce II,Grae Cannata E 11/13/2014, 8:01 AM

## 2014-11-13 NOTE — Progress Notes (Signed)
Subjective: Postpartum Day 2: Cesarean Delivery Patient reports tolerating PO, + flatus and no problems voiding.    Objective: Vital signs in last 24 hours: Temp:  [97.6 F (36.4 C)-98.4 F (36.9 C)] 98 F (36.7 C) (06/02 0617) Pulse Rate:  [64-88] 77 (06/02 0617) Resp:  [18] 18 (06/02 0617) BP: (98-114)/(47-60) 98/47 mmHg (06/02 0617) SpO2:  [97 %-98 %] 97 % (06/01 1820)  Physical Exam:  General: alert, cooperative and no distress Lochia: appropriate Uterine Fundus: firm Incision: healing well DVT Evaluation: No evidence of DVT seen on physical exam.   Recent Labs  11/12/14 0620  HGB 9.9*  HCT 30.5*    Assessment/Plan: Status post Cesarean section. Doing well postoperatively.  Continue current care.  Nancye Grumbine II,Lannis Lichtenwalner E 11/13/2014, 7:54 AM

## 2014-11-13 NOTE — Discharge Instructions (Signed)
No vaginal entry °No operation of automobiles °

## 2014-12-31 ENCOUNTER — Other Ambulatory Visit: Payer: Self-pay | Admitting: Obstetrics and Gynecology

## 2015-01-01 LAB — CYTOLOGY - PAP

## 2016-04-17 ENCOUNTER — Encounter (HOSPITAL_BASED_OUTPATIENT_CLINIC_OR_DEPARTMENT_OTHER): Payer: Self-pay | Admitting: Emergency Medicine

## 2016-04-17 ENCOUNTER — Emergency Department (HOSPITAL_BASED_OUTPATIENT_CLINIC_OR_DEPARTMENT_OTHER): Payer: BLUE CROSS/BLUE SHIELD

## 2016-04-17 ENCOUNTER — Emergency Department (HOSPITAL_BASED_OUTPATIENT_CLINIC_OR_DEPARTMENT_OTHER)
Admission: EM | Admit: 2016-04-17 | Discharge: 2016-04-17 | Disposition: A | Discharge status: Home / Self Care | Payer: BLUE CROSS/BLUE SHIELD | Attending: Emergency Medicine | Admitting: Emergency Medicine

## 2016-04-17 DIAGNOSIS — Y999 Unspecified external cause status: Secondary | ICD-10-CM | POA: Insufficient documentation

## 2016-04-17 DIAGNOSIS — W1800XA Striking against unspecified object with subsequent fall, initial encounter: Secondary | ICD-10-CM | POA: Diagnosis not present

## 2016-04-17 DIAGNOSIS — Y929 Unspecified place or not applicable: Secondary | ICD-10-CM | POA: Insufficient documentation

## 2016-04-17 DIAGNOSIS — S63290A Dislocation of distal interphalangeal joint of right index finger, initial encounter: Secondary | ICD-10-CM | POA: Diagnosis not present

## 2016-04-17 DIAGNOSIS — S63259A Unspecified dislocation of unspecified finger, initial encounter: Secondary | ICD-10-CM

## 2016-04-17 DIAGNOSIS — S6991XA Unspecified injury of right wrist, hand and finger(s), initial encounter: Secondary | ICD-10-CM | POA: Diagnosis present

## 2016-04-17 DIAGNOSIS — Y939 Activity, unspecified: Secondary | ICD-10-CM | POA: Insufficient documentation

## 2016-04-17 MED ORDER — IBUPROFEN 600 MG PO TABS: 600.0000 mg | ORAL_TABLET | Freq: Four times a day (QID) | ORAL | 0 refills | Status: DC | PRN

## 2016-04-17 MED ORDER — LIDOCAINE HCL (PF) 1 % IJ SOLN
30.0000 mL | Freq: Once | INTRAMUSCULAR | Status: AC
  Administered 2016-04-17: 30 mL via INTRADERMAL
  Filled 2016-04-17: qty 30

## 2016-04-17 MED ORDER — OXYCODONE-ACETAMINOPHEN 5-325 MG PO TABS
1.0000 | ORAL_TABLET | Freq: Once | ORAL | Status: AC
  Administered 2016-04-17: 1 via ORAL
  Filled 2016-04-17: qty 1

## 2016-04-17 NOTE — Discharge Instructions (Signed)
Take your medications as prescribed. I also recommend applying ice to affected area for 15 minutes 3-4 times daily for additional pain relief. Follow-up with your primary care provider within the next week as needed. Please return to the Emergency Department if symptoms worsen or new onset of fever, redness, swelling, warmth, numbness, tingling, weakness, decreased range of motion.
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
## 2016-04-17 NOTE — ED Triage Notes (Signed)
Pt in with obvious deformity to R 2nd digit, states she hit something and finger is now malaligned at the joint. Pt denies sensation at finger tip but is warm with appropriate color. Pt alert, interactive, ambulatory in NAD.
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
## 2016-04-17 NOTE — ED Notes (Signed)
[facility] alert, NAD, calm, interactive, resps e/u, R index deformity noted, digital block explained, skin intact, ROM decreased d/t pain/deformity.

## 2016-10-09 ENCOUNTER — Emergency Department (HOSPITAL_COMMUNITY)
Admission: EM | Admit: 2016-10-09 | Discharge: 2016-10-09 | Disposition: A | Discharge status: Home / Self Care | Payer: BLUE CROSS/BLUE SHIELD | Attending: Emergency Medicine | Admitting: Emergency Medicine

## 2016-10-09 ENCOUNTER — Emergency Department (HOSPITAL_COMMUNITY): Payer: BLUE CROSS/BLUE SHIELD

## 2016-10-09 ENCOUNTER — Encounter (HOSPITAL_COMMUNITY): Payer: Self-pay | Admitting: *Deleted

## 2016-10-09 DIAGNOSIS — Y939 Activity, unspecified: Secondary | ICD-10-CM | POA: Insufficient documentation

## 2016-10-09 DIAGNOSIS — Y92481 Parking lot as the place of occurrence of the external cause: Secondary | ICD-10-CM | POA: Diagnosis not present

## 2016-10-09 DIAGNOSIS — Y999 Unspecified external cause status: Secondary | ICD-10-CM | POA: Insufficient documentation

## 2016-10-09 DIAGNOSIS — Z79899 Other long term (current) drug therapy: Secondary | ICD-10-CM | POA: Diagnosis not present

## 2016-10-09 DIAGNOSIS — S99922A Unspecified injury of left foot, initial encounter: Secondary | ICD-10-CM | POA: Diagnosis present

## 2016-10-09 DIAGNOSIS — S9032XA Contusion of left foot, initial encounter: Secondary | ICD-10-CM | POA: Diagnosis not present

## 2016-10-09 MED ORDER — HYDROCODONE-ACETAMINOPHEN 5-325 MG PO TABS: 1.0000 | ORAL_TABLET | ORAL | 0 refills | Status: DC | PRN
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
## 2016-10-09 NOTE — Discharge Instructions (Signed)
Rest, ice and elevate your foot as much as possible over the next several days.  Your xrays are negative and I suspect this injury will resolve with time.  However, if you continue to have problems weight bearing beyond the next 10-14 days, or you develop any new symptoms,   call Dr. Rennis Chris for further evaluation. Your xrays are negative today.  You may take the hydrocodone prescribed for pain relief.  This will make you drowsy - do not drive within 4 hours of taking this medication.  I also recommend taking ibuprofen (motrin) 600 mg every 8 hours to also help with pain and swelling.
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
## 2016-11-02 ENCOUNTER — Emergency Department (HOSPITAL_COMMUNITY)
Admission: EM | Admit: 2016-11-02 | Discharge: 2016-11-02 | Disposition: A | Discharge status: Home / Self Care | Payer: BLUE CROSS/BLUE SHIELD | Attending: Emergency Medicine | Admitting: Emergency Medicine

## 2016-11-02 ENCOUNTER — Encounter (HOSPITAL_COMMUNITY): Payer: Self-pay | Admitting: *Deleted

## 2016-11-02 ENCOUNTER — Emergency Department (HOSPITAL_COMMUNITY): Payer: BLUE CROSS/BLUE SHIELD

## 2016-11-02 DIAGNOSIS — Y929 Unspecified place or not applicable: Secondary | ICD-10-CM | POA: Insufficient documentation

## 2016-11-02 DIAGNOSIS — Y939 Activity, unspecified: Secondary | ICD-10-CM | POA: Diagnosis not present

## 2016-11-02 DIAGNOSIS — W208XXA Other cause of strike by thrown, projected or falling object, initial encounter: Secondary | ICD-10-CM | POA: Diagnosis not present

## 2016-11-02 DIAGNOSIS — S9032XA Contusion of left foot, initial encounter: Secondary | ICD-10-CM | POA: Insufficient documentation

## 2016-11-02 DIAGNOSIS — S99922A Unspecified injury of left foot, initial encounter: Secondary | ICD-10-CM | POA: Diagnosis present

## 2016-11-02 DIAGNOSIS — Y999 Unspecified external cause status: Secondary | ICD-10-CM | POA: Diagnosis not present

## 2016-11-02 MED ORDER — HYDROCODONE-ACETAMINOPHEN 5-325 MG PO TABS
1.0000 | ORAL_TABLET | Freq: Once | ORAL | Status: AC
  Administered 2016-11-02: 1 via ORAL
  Filled 2016-11-02: qty 1

## 2016-11-02 MED ORDER — ONDANSETRON 4 MG PO TBDP
4.0000 mg | ORAL_TABLET | Freq: Once | ORAL | Status: AC
  Administered 2016-11-02: 4 mg via ORAL
  Filled 2016-11-02: qty 1

## 2016-11-02 NOTE — Discharge Instructions (Signed)
Elevate and apply ice packs on/off to your foot.  Wear your cam boot for at least another 1-2 weeks as needed.  Ibuprofen 600 mg 3 times a day as needed for pain.  Follow-up with your doctor.

## 2016-11-02 NOTE — ED Triage Notes (Signed)
Pt comes in for a foot injury to her left foot. States she dropped a weight on this foot.
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
## 2017-06-01 ENCOUNTER — Ambulatory Visit: Payer: Self-pay | Admitting: Podiatry

## 2017-06-15 ENCOUNTER — Encounter: Payer: Self-pay | Admitting: Podiatry

## 2017-08-14 ENCOUNTER — Institutional Professional Consult (permissible substitution): Payer: BLUE CROSS/BLUE SHIELD | Admitting: Pulmonary Disease

## 2017-10-16 ENCOUNTER — Institutional Professional Consult (permissible substitution): Payer: Self-pay | Admitting: Pulmonary Disease

## 2017-10-26 NOTE — Progress Notes (Signed)
This encounter was created in error - please disregard.

## 2017-11-24 ENCOUNTER — Institutional Professional Consult (permissible substitution): Payer: Self-pay | Admitting: Pulmonary Disease

## 2018-01-03 ENCOUNTER — Institutional Professional Consult (permissible substitution): Payer: Self-pay | Admitting: Pulmonary Disease

## 2018-01-18 ENCOUNTER — Ambulatory Visit (INDEPENDENT_AMBULATORY_CARE_PROVIDER_SITE_OTHER): Payer: BLUE CROSS/BLUE SHIELD | Admitting: Pulmonary Disease

## 2018-01-18 ENCOUNTER — Encounter: Payer: Self-pay | Admitting: Pulmonary Disease

## 2018-01-18 VITALS — BP 100/70 | HR 78 | Ht 64.0 in | Wt 190.0 lb

## 2018-01-18 DIAGNOSIS — G4733 Obstructive sleep apnea (adult) (pediatric): Secondary | ICD-10-CM | POA: Diagnosis not present

## 2018-01-18 NOTE — Patient Instructions (Signed)
High probability of significant obstructive sleep apnea  Significant daytime symptoms, nonrestorative sleep   Home sleep study  Possibility of an auto titrating CPAP therapy  I will see you back in the office about 6 to 8 weeks following initiation of treatment

## 2018-01-18 NOTE — Progress Notes (Signed)
Para Lisa Bates    [id number]    10/11/1990  Primary Care Physician:Kaplan, Isidor HoltsKristen W., PA-C  Referring Physician: Richmond CampbellKaplan, Kristen W., PA-C [address] [place], [place] [id number]  Chief complaint:    History of snoring, witnessed apneas, nonrestorative sleep  HPI:  Patient with trouble sleeping, waking up gasping for air, trouble breathing while sleeping Witnessed apneas Told that she snores Usually goes to bed about 11 PM, takes about 30 to 45 minutes to fall asleep Wakes up a few times during the night either to 10 to her kids to use the bathroom Usually wakes up about 10:30 in the morning  She does not feel restored whenever she wakes up in the morning  Has gained about 30 pounds recently History of anxiety and depression-well-controlled  Occupation: No pertinent occupational history Exposures: No significant exposures Smoking history: Non-smoker Travel history: Recent travels  Outpatient Encounter Medications as of 01/18/2018  Medication Sig  . acetaminophen (TYLENOL) 500 MG tablet Take 1,000 mg by mouth every 6 (six) hours as needed for moderate pain.   Marland Kitchen. ALPRAZolam (XANAX) 0.25 MG tablet Take one pill per day as needed for anxiety  . citalopram (CELEXA) 20 MG tablet Take by mouth.  Marland Kitchen. ibuprofen (ADVIL,MOTRIN) 600 MG tablet Take 1 tablet (600 mg total) by mouth every 6 (six) hours as needed.  Marland Kitchen. levonorgestrel (MIRENA) 20 MCG/24HR IUD 1 Intra Uterine Device by Intrauterine route once.  . Prenatal Vit-Fe Fumarate-FA (PRENATAL MULTIVITAMIN) TABS Take 1 tablet by mouth daily.  . [DISCONTINUED] escitalopram (LEXAPRO) 20 MG tablet Take 1 tablet by mouth daily.  . [DISCONTINUED] HYDROcodone-acetaminophen (NORCO/VICODIN) 5-325 MG tablet Take 1 tablet by mouth every 4 (four) hours as needed.  . [DISCONTINUED] oxyCODONE-acetaminophen (PERCOCET/ROXICET) 5-325 MG per tablet Take 1-2 tablets by mouth every 6 (six) hours as needed (for pain scale 4-7).   No  facility-administered encounter medications on file as of 01/18/2018.     Allergies as of 01/18/2018  . (No Known Allergies)    Past Medical History:  Diagnosis Date  . Depression    not on meds, doing good  . Headache(784.0)    otc med prn    Past Surgical History:  Procedure Laterality Date  . CESAREAN SECTION  07/04/2012   Procedure: CESAREAN SECTION;  Surgeon: Leslie AndreaJames E Tomblin II, MD;  Location: [facility];  Service: Obstetrics;  Laterality: N/A;  Primary Cesarean Section Delivery Baby Girl @ 1840, Apgars 3/9   . CESAREAN SECTION N/A 11/11/2014   Procedure: REPEAT CESAREAN SECTION;  Surgeon: Harold HedgeJames Tomblin, MD;  Location: [facility];  Service: Obstetrics;  Laterality: N/A;    Family History  Problem Relation Age of Onset  . Depression Mother   . Hypertension Maternal Grandmother   . Cancer Maternal Grandmother   . Depression Maternal Grandmother   . Diabetes Paternal Grandmother   . Diabetes Paternal Grandfather   . Other Neg Hx     Social History   Socioeconomic History  . Marital status: Married    Spouse name: Not on file  . Number of children: Not on file  . Years of education: Not on file  . Highest education level: Not on file  Occupational History  . Not on file  Social Needs  . Financial resource strain: Not on file  . Food insecurity:    Worry: Not on file    Inability: Not on file  . Transportation needs:    Medical: Not on file  Non-medical: Not on file  Tobacco Use  . Smoking status: Never Smoker  . Smokeless tobacco: Never Used  Substance and Sexual Activity  . Alcohol use: Yes    Comment: socially  . Drug use: No  . Sexual activity: Yes    Birth control/protection: None    Comment: pregnant  Lifestyle  . Physical activity:    Days per week: Not on file    Minutes per session: Not on file  . Stress: Not on file  Relationships  . Social connections:    Talks on phone: Not on file    Gets together: Not on file    Attends religious service:  Not on file    Active member of club or organization: Not on file    Attends meetings of clubs or organizations: Not on file    Relationship status: Not on file  . Intimate partner violence:    Fear of current or ex partner: Not on file    Emotionally abused: Not on file    Physically abused: Not on file    Forced sexual activity: Not on file  Other Topics Concern  . Not on file  Social History Narrative  . Not on file    Review of systems: Review of Systems  Constitutional: Negative for fever and chills.  HENT: Negative.   Eyes: Negative for blurred vision.  Respiratory: as per HPI  Cardiovascular: Negative for chest pain and palpitations.  Gastrointestinal: Negative for vomiting, diarrhea, blood per rectum. Genitourinary: Negative for dysuria, urgency, frequency and hematuria.  Musculoskeletal: Negative for myalgias, back pain and joint pain.  Skin: Negative for itching and rash.  Neurological: Negative for dizziness, tremors, focal weakness, seizures and loss of consciousness.  Endo/Heme/Allergies: Negative for environmental allergies.  Psychiatric/Behavioral: History of depression All other systems reviewed and are negative.  Physical Exam:  Vitals:   01/18/18 1607  BP: 100/70  Pulse: 78  SpO2: 98%   Gen:      No acute distress HEENT:  EOMI, sclera anicteric Neck:     No masses; no thyromegaly Lungs:    Clear to auscultation bilaterally; normal respiratory effort CV:         Regular rate and rhythm; no murmurs Abd:      + bowel sounds; soft, non-tender; no palpable masses, no distension Ext:    No edema; adequate peripheral perfusion Skin:      Warm and dry; no rash Neuro: alert and oriented x 3 Psych: normal mood and affect  Assessment:   High probability of significant sleep disordered breathing based on symptoms  Nonrestorative sleep  History of depression  Plan/Recommendations:  Home sleep study  Encouraged regarding weight loss  Optimize sleep  hygiene  We will see her back in the office about 6 to 8 weeks following initiation of treatment  Virl Diamond MD [facility] 01/18/2018, 4:33 PM  CC: Richmond Campbell., PA-C

## 2018-01-31 DIAGNOSIS — G4733 Obstructive sleep apnea (adult) (pediatric): Secondary | ICD-10-CM

## 2018-02-01 ENCOUNTER — Other Ambulatory Visit: Payer: Self-pay | Admitting: *Deleted

## 2018-02-01 DIAGNOSIS — G4733 Obstructive sleep apnea (adult) (pediatric): Secondary | ICD-10-CM | POA: Diagnosis not present

## 2018-02-05 ENCOUNTER — Telehealth: Payer: Self-pay | Admitting: Pulmonary Disease

## 2018-02-05 DIAGNOSIS — G4733 Obstructive sleep apnea (adult) (pediatric): Secondary | ICD-10-CM

## 2018-02-05 NOTE — Telephone Encounter (Signed)
Per AO, HST showed mild OSA with significant daytime sleepiness. He recommends cpap therapy of autocpap 5-15cm and an OV within 6-8 weeks of receiving machine.   Spoke with patient. She is aware of results. She wishes to proceed with cpap therapy. Advised her that I would place the order today, she verbalized understanding.   She has an F/U appt with AO in October.

## 2018-03-13 ENCOUNTER — Telehealth: Payer: Self-pay | Admitting: Pulmonary Disease

## 2018-03-13 NOTE — Telephone Encounter (Signed)
Lm to reschedule 03/14/18 OV, as pt has not been setup on cpap for 31-90 days. Per [facility] pt has had cpap for 20 days.  Will await call back.

## 2018-03-14 ENCOUNTER — Ambulatory Visit: Payer: BLUE CROSS/BLUE SHIELD | Admitting: Pulmonary Disease

## 2018-03-19 NOTE — Telephone Encounter (Signed)
It appears that pt no showed 03/14/18 OV. lmtcb x2 for pt to reschedule.   Will route to [facility] for f/u.

## 2018-03-27 NOTE — Telephone Encounter (Signed)
LMOM to reschedule

## 2018-04-04 NOTE — Telephone Encounter (Signed)
Attempted to call pt but no answer. Left message for pt to return call x3. 

## 2018-04-06 NOTE — Telephone Encounter (Signed)
Attempted to call pt but unable to reach her. Left message for pt to return call. Due to multiple attempts trying to reach pt with no success, per triage protocol message will be closed. 

## 2018-06-21 ENCOUNTER — Ambulatory Visit: Payer: BLUE CROSS/BLUE SHIELD | Admitting: Podiatry

## 2018-06-27 ENCOUNTER — Other Ambulatory Visit: Payer: Self-pay | Admitting: Podiatry

## 2018-06-27 ENCOUNTER — Ambulatory Visit: Payer: BLUE CROSS/BLUE SHIELD | Admitting: Podiatry

## 2018-06-27 ENCOUNTER — Encounter: Payer: Self-pay | Admitting: Podiatry

## 2018-06-27 ENCOUNTER — Ambulatory Visit (INDEPENDENT_AMBULATORY_CARE_PROVIDER_SITE_OTHER): Payer: BLUE CROSS/BLUE SHIELD

## 2018-06-27 VITALS — BP 112/71 | HR 79 | Resp 16

## 2018-06-27 DIAGNOSIS — M722 Plantar fascial fibromatosis: Secondary | ICD-10-CM

## 2018-06-27 DIAGNOSIS — G5753 Tarsal tunnel syndrome, bilateral lower limbs: Secondary | ICD-10-CM

## 2018-06-27 DIAGNOSIS — M79671 Pain in right foot: Secondary | ICD-10-CM

## 2018-06-27 DIAGNOSIS — M79672 Pain in left foot: Secondary | ICD-10-CM

## 2018-06-27 MED ORDER — TRIAMCINOLONE ACETONIDE 10 MG/ML IJ SUSP
10.0000 mg | Freq: Once | INTRAMUSCULAR | Status: AC
  Administered 2018-06-27: 10 mg

## 2018-06-27 MED ORDER — DICLOFENAC SODIUM 75 MG PO TBEC
75.0000 mg | DELAYED_RELEASE_TABLET | Freq: Two times a day (BID) | ORAL | 2 refills | Status: DC
Start: 2018-06-27 — End: 2019-08-15

## 2018-06-27 NOTE — Patient Instructions (Signed)

## 2018-06-27 NOTE — Progress Notes (Signed)
   Subjective:    Patient ID: Lisa Bates, female    DOB: 12/29/1990, 29 y.o.   MRN: [id number]  HPI    Review of Systems  All other systems reviewed and are negative.      Objective:   Physical Exam        Assessment & Plan:

## 2018-06-28 NOTE — Progress Notes (Signed)
Subjective:   Patient ID: Lisa Bates, female   DOB: 28 y.o.   MRN: [id number]   HPI Patient presents with caregiver stating that she has had a lot of pain in both of her heels with history of this in the family.  States that both of them bother her and it is very sore after she is on them for a while and after she sits and also she occasionally gets shooting pains into her arches.  Patient does not smoke and likes to be active   Review of Systems  All other systems reviewed and are negative.       Objective:  Physical Exam Vitals signs and nursing note reviewed.  Constitutional:      Appearance: She is well-developed.  Pulmonary:     Effort: Pulmonary effort is normal.  Musculoskeletal: Normal range of motion.  Skin:    General: Skin is warm.  Neurological:     Mental Status: She is alert.     Neurovascular status intact muscle strength is adequate range of motion within normal limits with patient noted to have exquisite discomfort in the plantar heel region bilateral with inflammation fluid of the medial fascial band at its insertion into the calcaneus.  Patient appears also to have mild tarsal tunnel Tinel sign symptomatology that may be contributory or may be coincidental to this condition and also was found to have good digital perfusion well oriented x3     Assessment:  Acute plantar fasciitis bilateral with possibility for mild tarsal tunnel symptomatology     Plan:  H&P condition reviewed and today I did sterile prep and injected each plantar fascia at the insertion calcaneus 3 mg Kenalog 5 mg Xylocaine applied fascial brace bilateral.  Placed on diclofenac 75 mg twice daily gave instructions for supportive shoes and stretching exercises and reappoint to recheck 2 weeks  X-ray indicates that there is no spur formation with no indications for stress fracture or arthritic condition

## 2018-07-11 ENCOUNTER — Ambulatory Visit: Payer: BLUE CROSS/BLUE SHIELD | Admitting: Podiatry

## 2018-07-23 ENCOUNTER — Ambulatory Visit: Payer: BLUE CROSS/BLUE SHIELD | Admitting: Podiatry

## 2018-08-01 ENCOUNTER — Ambulatory Visit: Payer: BLUE CROSS/BLUE SHIELD | Admitting: Podiatry

## 2018-09-08 ENCOUNTER — Other Ambulatory Visit: Payer: Self-pay

## 2018-09-08 ENCOUNTER — Emergency Department (HOSPITAL_COMMUNITY)
Admission: EM | Admit: 2018-09-08 | Discharge: 2018-09-08 | Disposition: A | Discharge status: Home / Self Care | Payer: BLUE CROSS/BLUE SHIELD | Attending: Emergency Medicine | Admitting: Emergency Medicine

## 2018-09-08 ENCOUNTER — Encounter (HOSPITAL_COMMUNITY): Payer: Self-pay | Admitting: Emergency Medicine

## 2018-09-08 DIAGNOSIS — N72 Inflammatory disease of cervix uteri: Secondary | ICD-10-CM

## 2018-09-08 DIAGNOSIS — R3911 Hesitancy of micturition: Secondary | ICD-10-CM | POA: Diagnosis present

## 2018-09-08 DIAGNOSIS — Z79899 Other long term (current) drug therapy: Secondary | ICD-10-CM | POA: Diagnosis not present

## 2018-09-08 LAB — CBC WITH DIFFERENTIAL/PLATELET
Abs Immature Granulocytes: 0.03 10*3/uL (ref 0.00–0.07)
BASOS ABS: 0.1 10*3/uL (ref 0.0–0.1)
Basophils Relative: 1 %
Eosinophils Absolute: 0.4 10*3/uL (ref 0.0–0.5)
Eosinophils Relative: 4 %
HEMATOCRIT: 32 % — AB (ref 36.0–46.0)
HEMOGLOBIN: 9.6 g/dL — AB (ref 12.0–15.0)
Immature Granulocytes: 0 %
LYMPHS ABS: 3.3 10*3/uL (ref 0.7–4.0)
LYMPHS PCT: 33 %
MCH: 21 pg — ABNORMAL LOW (ref 26.0–34.0)
MCHC: 30 g/dL (ref 30.0–36.0)
MCV: 69.9 fL — ABNORMAL LOW (ref 80.0–100.0)
Monocytes Absolute: 1.1 10*3/uL — ABNORMAL HIGH (ref 0.1–1.0)
Monocytes Relative: 11 %
NEUTROS ABS: 5.2 10*3/uL (ref 1.7–7.7)
NEUTROS PCT: 51 %
Platelets: 240 10*3/uL (ref 150–400)
RBC: 4.58 MIL/uL (ref 3.87–5.11)
RDW: 17.5 % — ABNORMAL HIGH (ref 11.5–15.5)
WBC: 10.1 10*3/uL (ref 4.0–10.5)
nRBC: 0 % (ref 0.0–0.2)

## 2018-09-08 LAB — URINALYSIS, ROUTINE W REFLEX MICROSCOPIC
BILIRUBIN URINE: NEGATIVE
Glucose, UA: NEGATIVE mg/dL
Hgb urine dipstick: NEGATIVE
Ketones, ur: NEGATIVE mg/dL
Leukocytes,Ua: NEGATIVE
Nitrite: NEGATIVE
PROTEIN: NEGATIVE mg/dL
SPECIFIC GRAVITY, URINE: 1.018 (ref 1.005–1.030)
pH: 8 (ref 5.0–8.0)

## 2018-09-08 LAB — BASIC METABOLIC PANEL
ANION GAP: 8 (ref 5–15)
BUN: 13 mg/dL (ref 6–20)
CALCIUM: 9 mg/dL (ref 8.9–10.3)
CHLORIDE: 109 mmol/L (ref 98–111)
CO2: 23 mmol/L (ref 22–32)
Creatinine, Ser: 0.97 mg/dL (ref 0.44–1.00)
GFR calc non Af Amer: >60 mL/min (ref >60)
Glucose, Bld: 98 mg/dL (ref 70–99)
Potassium: 3.8 mmol/L (ref 3.5–5.1)
Sodium: 140 mmol/L (ref 135–145)

## 2018-09-08 LAB — WET PREP, GENITAL
Clue Cells Wet Prep HPF POC: NONE SEEN
SPERM: NONE SEEN
TRICH WET PREP: NONE SEEN
YEAST WET PREP: NONE SEEN

## 2018-09-08 LAB — PREGNANCY, URINE: PREG TEST UR: NEGATIVE

## 2018-09-08 MED ORDER — NAPROXEN 500 MG PO TABS: 500.0000 mg | ORAL_TABLET | Freq: Two times a day (BID) | ORAL | 0 refills | Status: DC

## 2018-09-08 MED ORDER — DOXYCYCLINE HYCLATE 100 MG PO TABS: 100.0000 mg | ORAL_TABLET | Freq: Two times a day (BID) | ORAL | 0 refills | Status: DC

## 2018-09-08 MED ORDER — AZITHROMYCIN 250 MG PO TABS
1000.0000 mg | ORAL_TABLET | Freq: Once | ORAL | Status: AC
  Administered 2018-09-08: 1000 mg via ORAL
  Filled 2018-09-08: qty 4

## 2018-09-08 MED ORDER — CEFTRIAXONE SODIUM 250 MG IJ SOLR
250.0000 mg | Freq: Once | INTRAMUSCULAR | Status: AC
  Administered 2018-09-08: 250 mg via INTRAMUSCULAR
  Filled 2018-09-08: qty 250

## 2018-09-08 MED ORDER — LIDOCAINE HCL (PF) 1 % IJ SOLN
INTRAMUSCULAR | Status: AC
  Administered 2018-09-08: 2 mL
  Filled 2018-09-08: qty 2
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
## 2018-09-08 NOTE — ED Triage Notes (Signed)
Trouble trying to urinate since last night

## 2018-09-08 NOTE — Discharge Instructions (Addendum)
Take The antibiotics as prescribed, follow-up with your OB/GYN doctor to make sure your symptoms are improving, return to the ED for fever worsening symptoms

## 2018-09-10 LAB — GC/CHLAMYDIA PROBE AMP (~~LOC~~) NOT AT ARMC
Chlamydia: NEGATIVE
NEISSERIA GONORRHEA: NEGATIVE

## 2018-09-10 LAB — RPR: RPR Ser Ql: NONREACTIVE

## 2018-09-10 LAB — HIV ANTIBODY (ROUTINE TESTING W REFLEX): HIV Screen 4th Generation wRfx: NONREACTIVE

## 2018-10-15 ENCOUNTER — Ambulatory Visit: Payer: BLUE CROSS/BLUE SHIELD | Admitting: Podiatry

## 2019-08-15 ENCOUNTER — Emergency Department (HOSPITAL_COMMUNITY): Payer: BC Managed Care – PPO

## 2019-08-15 ENCOUNTER — Other Ambulatory Visit: Payer: Self-pay

## 2019-08-15 ENCOUNTER — Observation Stay (HOSPITAL_COMMUNITY)
Admission: EM | Admit: 2019-08-15 | Discharge: 2019-08-17 | DRG: 392 | Disposition: A | Discharge status: Home / Self Care | Payer: BC Managed Care – PPO | Attending: Emergency Medicine | Admitting: Emergency Medicine

## 2019-08-15 ENCOUNTER — Encounter (HOSPITAL_COMMUNITY): Payer: Self-pay

## 2019-08-15 DIAGNOSIS — K449 Diaphragmatic hernia without obstruction or gangrene: Secondary | ICD-10-CM | POA: Diagnosis present

## 2019-08-15 DIAGNOSIS — Z818 Family history of other mental and behavioral disorders: Secondary | ICD-10-CM

## 2019-08-15 DIAGNOSIS — J9811 Atelectasis: Secondary | ICD-10-CM | POA: Diagnosis not present

## 2019-08-15 DIAGNOSIS — Z8249 Family history of ischemic heart disease and other diseases of the circulatory system: Secondary | ICD-10-CM | POA: Diagnosis not present

## 2019-08-15 DIAGNOSIS — R12 Heartburn: Secondary | ICD-10-CM | POA: Diagnosis present

## 2019-08-15 DIAGNOSIS — M25551 Pain in right hip: Secondary | ICD-10-CM | POA: Diagnosis present

## 2019-08-15 DIAGNOSIS — Z833 Family history of diabetes mellitus: Secondary | ICD-10-CM

## 2019-08-15 DIAGNOSIS — Z9851 Tubal ligation status: Secondary | ICD-10-CM

## 2019-08-15 DIAGNOSIS — Y92410 Unspecified street and highway as the place of occurrence of the external cause: Secondary | ICD-10-CM | POA: Diagnosis not present

## 2019-08-15 DIAGNOSIS — F329 Major depressive disorder, single episode, unspecified: Secondary | ICD-10-CM | POA: Diagnosis not present

## 2019-08-15 DIAGNOSIS — Z20822 Contact with and (suspected) exposure to covid-19: Secondary | ICD-10-CM | POA: Diagnosis not present

## 2019-08-15 DIAGNOSIS — S27808A Other injury of diaphragm, initial encounter: Secondary | ICD-10-CM | POA: Diagnosis present

## 2019-08-15 DIAGNOSIS — M549 Dorsalgia, unspecified: Secondary | ICD-10-CM | POA: Diagnosis present

## 2019-08-15 DIAGNOSIS — S27329A Contusion of lung, unspecified, initial encounter: Secondary | ICD-10-CM | POA: Diagnosis not present

## 2019-08-15 DIAGNOSIS — R519 Headache, unspecified: Secondary | ICD-10-CM | POA: Diagnosis not present

## 2019-08-15 LAB — COMPREHENSIVE METABOLIC PANEL
ALT: 20 U/L (ref 0–44)
AST: 17 U/L (ref 15–41)
Albumin: 4 g/dL (ref 3.5–5.0)
Alkaline Phosphatase: 85 U/L (ref 38–126)
Anion gap: 10 (ref 5–15)
BUN: 12 mg/dL (ref 6–20)
CO2: 22 mmol/L (ref 22–32)
Calcium: 8.9 mg/dL (ref 8.9–10.3)
Chloride: 103 mmol/L (ref 98–111)
Creatinine, Ser: 0.66 mg/dL (ref 0.44–1.00)
GFR calc Af Amer: >60 mL/min (ref >60)
GFR calc non Af Amer: >60 mL/min (ref >60)
Glucose, Bld: 85 mg/dL (ref 70–99)
Potassium: 3.7 mmol/L (ref 3.5–5.1)
Sodium: 135 mmol/L (ref 135–145)
Total Bilirubin: 0.3 mg/dL (ref 0.3–1.2)
Total Protein: 7.3 g/dL (ref 6.5–8.1)

## 2019-08-15 LAB — CBC
HCT: 42.9 % (ref 36.0–46.0)
Hemoglobin: 12.5 g/dL (ref 12.0–15.0)
MCH: 22 pg — ABNORMAL LOW (ref 26.0–34.0)
MCHC: 29.1 g/dL — ABNORMAL LOW (ref 30.0–36.0)
MCV: 75.4 fL — ABNORMAL LOW (ref 80.0–100.0)
Platelets: 228 10*3/uL (ref 150–400)
RBC: 5.69 MIL/uL — ABNORMAL HIGH (ref 3.87–5.11)
RDW: 22.9 % — ABNORMAL HIGH (ref 11.5–15.5)
WBC: 10.7 10*3/uL — ABNORMAL HIGH (ref 4.0–10.5)
nRBC: 0 % (ref 0.0–0.2)

## 2019-08-15 LAB — TROPONIN I (HIGH SENSITIVITY): Troponin I (High Sensitivity): <2 ng/L (ref <18)

## 2019-08-15 MED ORDER — IOHEXOL 300 MG/ML  SOLN
100.0000 mL | Freq: Once | INTRAMUSCULAR | Status: AC | PRN
  Administered 2019-08-15: 20:00:00 100 mL via INTRAVENOUS

## 2019-08-15 MED ORDER — KETOROLAC TROMETHAMINE 30 MG/ML IJ SOLN
15.0000 mg | Freq: Once | INTRAMUSCULAR | Status: AC
  Administered 2019-08-15: 15 mg via INTRAVENOUS
  Filled 2019-08-15: qty 1

## 2019-08-15 MED ORDER — MORPHINE SULFATE (PF) 4 MG/ML IV SOLN
4.0000 mg | Freq: Once | INTRAVENOUS | Status: AC
  Administered 2019-08-15: 4 mg via INTRAVENOUS
  Filled 2019-08-15: qty 1

## 2019-08-15 MED ORDER — SODIUM CHLORIDE 0.9 % IV BOLUS
1000.0000 mL | Freq: Once | INTRAVENOUS | Status: AC
  Administered 2019-08-15: 1000 mL via INTRAVENOUS

## 2019-08-15 NOTE — ED Notes (Signed)
Pt ambulatory to restroom
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
## 2019-08-16 ENCOUNTER — Encounter (HOSPITAL_COMMUNITY): Payer: Self-pay

## 2019-08-16 DIAGNOSIS — Z833 Family history of diabetes mellitus: Secondary | ICD-10-CM | POA: Diagnosis not present

## 2019-08-16 DIAGNOSIS — Z818 Family history of other mental and behavioral disorders: Secondary | ICD-10-CM | POA: Diagnosis not present

## 2019-08-16 DIAGNOSIS — S27329A Contusion of lung, unspecified, initial encounter: Secondary | ICD-10-CM | POA: Diagnosis not present

## 2019-08-16 DIAGNOSIS — R519 Headache, unspecified: Secondary | ICD-10-CM | POA: Diagnosis not present

## 2019-08-16 DIAGNOSIS — R12 Heartburn: Secondary | ICD-10-CM | POA: Diagnosis not present

## 2019-08-16 DIAGNOSIS — Y92410 Unspecified street and highway as the place of occurrence of the external cause: Secondary | ICD-10-CM | POA: Diagnosis not present

## 2019-08-16 DIAGNOSIS — J9811 Atelectasis: Secondary | ICD-10-CM | POA: Diagnosis not present

## 2019-08-16 DIAGNOSIS — Z20822 Contact with and (suspected) exposure to covid-19: Secondary | ICD-10-CM | POA: Diagnosis not present

## 2019-08-16 DIAGNOSIS — S27808A Other injury of diaphragm, initial encounter: Secondary | ICD-10-CM | POA: Diagnosis not present

## 2019-08-16 DIAGNOSIS — Z9851 Tubal ligation status: Secondary | ICD-10-CM | POA: Diagnosis not present

## 2019-08-16 DIAGNOSIS — K449 Diaphragmatic hernia without obstruction or gangrene: Secondary | ICD-10-CM | POA: Diagnosis not present

## 2019-08-16 DIAGNOSIS — M25551 Pain in right hip: Secondary | ICD-10-CM | POA: Diagnosis not present

## 2019-08-16 DIAGNOSIS — M549 Dorsalgia, unspecified: Secondary | ICD-10-CM | POA: Diagnosis not present

## 2019-08-16 DIAGNOSIS — Z8249 Family history of ischemic heart disease and other diseases of the circulatory system: Secondary | ICD-10-CM | POA: Diagnosis not present

## 2019-08-16 DIAGNOSIS — F329 Major depressive disorder, single episode, unspecified: Secondary | ICD-10-CM | POA: Diagnosis not present

## 2019-08-16 LAB — BASIC METABOLIC PANEL
Anion gap: 10 (ref 5–15)
BUN: 9 mg/dL (ref 6–20)
CO2: 24 mmol/L (ref 22–32)
Calcium: 8.5 mg/dL — ABNORMAL LOW (ref 8.9–10.3)
Chloride: 105 mmol/L (ref 98–111)
Creatinine, Ser: 0.75 mg/dL (ref 0.44–1.00)
GFR calc Af Amer: >60 mL/min (ref >60)
GFR calc non Af Amer: >60 mL/min (ref >60)
Glucose, Bld: 96 mg/dL (ref 70–99)
Potassium: 3.8 mmol/L (ref 3.5–5.1)
Sodium: 139 mmol/L (ref 135–145)

## 2019-08-16 LAB — CBC
HCT: 38.3 % (ref 36.0–46.0)
Hemoglobin: 11.6 g/dL — ABNORMAL LOW (ref 12.0–15.0)
MCH: 21.8 pg — ABNORMAL LOW (ref 26.0–34.0)
MCHC: 30.3 g/dL (ref 30.0–36.0)
MCV: 72 fL — ABNORMAL LOW (ref 80.0–100.0)
Platelets: 238 10*3/uL (ref 150–400)
RBC: 5.32 MIL/uL — ABNORMAL HIGH (ref 3.87–5.11)
RDW: 22.5 % — ABNORMAL HIGH (ref 11.5–15.5)
WBC: 10.8 10*3/uL — ABNORMAL HIGH (ref 4.0–10.5)
nRBC: 0 % (ref 0.0–0.2)

## 2019-08-16 LAB — RESPIRATORY PANEL BY RT PCR (FLU A&B, COVID)
Influenza A by PCR: NEGATIVE
Influenza B by PCR: NEGATIVE
SARS Coronavirus 2 by RT PCR: NEGATIVE

## 2019-08-16 MED ORDER — METOCLOPRAMIDE HCL 5 MG/ML IJ SOLN
10.0000 mg | Freq: Once | INTRAMUSCULAR | Status: AC
  Administered 2019-08-16: 20:00:00 10 mg via INTRAVENOUS
  Filled 2019-08-16: qty 2

## 2019-08-16 MED ORDER — KCL IN DEXTROSE-NACL 20-5-0.45 MEQ/L-%-% IV SOLN
INTRAVENOUS | Status: DC
  Filled 2019-08-16 (×2): qty 1000

## 2019-08-16 MED ORDER — OXYCODONE HCL 5 MG PO TABS
10.0000 mg | ORAL_TABLET | ORAL | Status: DC | PRN
  Filled 2019-08-16: qty 2

## 2019-08-16 MED ORDER — OXYCODONE HCL 5 MG PO TABS
5.0000 mg | ORAL_TABLET | ORAL | Status: DC | PRN
  Administered 2019-08-16 – 2019-08-17 (×4): 5 mg via ORAL
  Filled 2019-08-16 (×4): qty 1

## 2019-08-16 MED ORDER — PANTOPRAZOLE SODIUM 40 MG PO TBEC
40.0000 mg | DELAYED_RELEASE_TABLET | Freq: Every day | ORAL | Status: DC
  Administered 2019-08-16 – 2019-08-17 (×2): 40 mg via ORAL
  Filled 2019-08-16 (×2): qty 1

## 2019-08-16 MED ORDER — MORPHINE SULFATE (PF) 2 MG/ML IV SOLN: 1.0000 mg | INTRAVENOUS | Status: DC | PRN

## 2019-08-16 MED ORDER — ENOXAPARIN SODIUM 40 MG/0.4ML ~~LOC~~ SOLN
40.0000 mg | SUBCUTANEOUS | Status: DC
  Administered 2019-08-16: 20:00:00 40 mg via SUBCUTANEOUS
  Filled 2019-08-16: qty 0.4

## 2019-08-16 MED ORDER — ONDANSETRON HCL 4 MG/2ML IJ SOLN: 4.0000 mg | Freq: Four times a day (QID) | INTRAMUSCULAR | Status: DC | PRN

## 2019-08-16 MED ORDER — PANTOPRAZOLE SODIUM 40 MG IV SOLR: 40.0000 mg | Freq: Every day | INTRAVENOUS | Status: DC

## 2019-08-16 MED ORDER — ALPRAZOLAM 0.5 MG PO TABS: 0.5000 mg | ORAL_TABLET | Freq: Two times a day (BID) | ORAL | Status: DC | PRN

## 2019-08-16 MED ORDER — ONDANSETRON 4 MG PO TBDP
4.0000 mg | ORAL_TABLET | Freq: Four times a day (QID) | ORAL | Status: DC | PRN
  Administered 2019-08-16: 17:00:00 4 mg via ORAL
  Filled 2019-08-16: qty 1

## 2019-08-16 MED ORDER — MORPHINE SULFATE (PF) 4 MG/ML IV SOLN
4.0000 mg | Freq: Once | INTRAVENOUS | Status: AC
  Administered 2019-08-16: 01:00:00 4 mg via INTRAVENOUS
  Filled 2019-08-16: qty 1

## 2019-08-16 MED ORDER — ACETAMINOPHEN 500 MG PO TABS
1000.0000 mg | ORAL_TABLET | Freq: Three times a day (TID) | ORAL | Status: DC
  Administered 2019-08-16 – 2019-08-17 (×5): 1000 mg via ORAL
  Filled 2019-08-16 (×5): qty 2
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
## 2019-08-16 NOTE — Progress Notes (Signed)
Patient admitted to room [phone]. Alert and oriented x4. Denies pain at this time. Notified Dr. Andrey Campanile of patient arrival to floor.

## 2019-08-16 NOTE — Plan of Care (Signed)
  Problem: Education: Goal: Knowledge of General Education information will improve Description Including pain rating scale, medication(s)/side effects and non-pharmacologic comfort measures Outcome: Progressing   

## 2019-08-16 NOTE — Progress Notes (Signed)
Pt complained of chest pain 5/10. Vital signs stable. Micheal Maczis made aware. Will continue to monitor pt.

## 2019-08-17 DIAGNOSIS — K449 Diaphragmatic hernia without obstruction or gangrene: Secondary | ICD-10-CM | POA: Diagnosis not present

## 2019-08-17 LAB — BASIC METABOLIC PANEL
Anion gap: 9 (ref 5–15)
BUN: 5 mg/dL — ABNORMAL LOW (ref 6–20)
CO2: 25 mmol/L (ref 22–32)
Calcium: 8.6 mg/dL — ABNORMAL LOW (ref 8.9–10.3)
Chloride: 104 mmol/L (ref 98–111)
Creatinine, Ser: 0.8 mg/dL (ref 0.44–1.00)
GFR calc Af Amer: >60 mL/min (ref >60)
GFR calc non Af Amer: >60 mL/min (ref >60)
Glucose, Bld: 99 mg/dL (ref 70–99)
Potassium: 4 mmol/L (ref 3.5–5.1)
Sodium: 138 mmol/L (ref 135–145)

## 2019-08-17 LAB — CBC
HCT: 39.6 % (ref 36.0–46.0)
Hemoglobin: 11.8 g/dL — ABNORMAL LOW (ref 12.0–15.0)
MCH: 22 pg — ABNORMAL LOW (ref 26.0–34.0)
MCHC: 29.8 g/dL — ABNORMAL LOW (ref 30.0–36.0)
MCV: 73.9 fL — ABNORMAL LOW (ref 80.0–100.0)
Platelets: 234 10*3/uL (ref 150–400)
RBC: 5.36 MIL/uL — ABNORMAL HIGH (ref 3.87–5.11)
RDW: 22 % — ABNORMAL HIGH (ref 11.5–15.5)
WBC: 7.5 10*3/uL (ref 4.0–10.5)
nRBC: 0 % (ref 0.0–0.2)

## 2019-08-17 MED ORDER — APAP 325 MG PO TABS: 650.0000 mg | ORAL_TABLET | Freq: Four times a day (QID) | ORAL | Status: DC | PRN

## 2019-08-17 MED ORDER — ONDANSETRON 4 MG PO TBDP: 4.0000 mg | ORAL_TABLET | Freq: Four times a day (QID) | ORAL | 0 refills | Status: DC | PRN

## 2019-08-17 MED ORDER — OXYCODONE HCL 5 MG PO TABS: 5.0000 mg | ORAL_TABLET | Freq: Four times a day (QID) | ORAL | 0 refills | Status: DC | PRN

## 2019-08-17 MED ORDER — PANTOPRAZOLE SODIUM 40 MG PO TBEC: 40.0000 mg | DELAYED_RELEASE_TABLET | Freq: Every day | ORAL | 0 refills | Status: DC
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
## 2019-08-17 NOTE — Discharge Instructions (Signed)
Hiatal Hernia  A hiatal hernia occurs when part of the stomach slides above the muscle that separates the abdomen from the chest (diaphragm). A person can be born with a hiatal hernia (congenital), or it may develop over time. In almost all cases of hiatal hernia, only the top part of the stomach pushes through the diaphragm. Many people have a hiatal hernia with no symptoms. The larger the hernia, the more likely it is that you will have symptoms. In some cases, a hiatal hernia allows stomach acid to flow back into the tube that carries food from your mouth to your stomach (esophagus). This may cause heartburn symptoms. Severe heartburn symptoms may mean that you have developed a condition called gastroesophageal reflux disease (GERD). What are the causes? This condition is caused by a weakness in the opening (hiatus) where the esophagus passes through the diaphragm to attach to the upper part of the stomach. A person may be born with a weakness in the hiatus, or a weakness can develop over time. What increases the risk? This condition is more likely to develop in:  Older people. Age is a major risk factor for a hiatal hernia, especially if you are over the age of 32.  Pregnant women.  People who are overweight.  People who have frequent constipation. What are the signs or symptoms? Symptoms of this condition usually develop in the form of GERD symptoms. Symptoms include:  Heartburn.  Belching.  Indigestion.  Trouble swallowing.  Coughing or wheezing.  Sore throat.  Hoarseness.  Chest pain.  Nausea and vomiting. How is this diagnosed? This condition may be diagnosed during testing for GERD. Tests that may be done include:  X-rays of your stomach or chest.  An upper gastrointestinal (GI) series. This is an X-ray exam of your GI tract that is taken after you swallow a chalky liquid that shows up clearly on the X-ray.  Endoscopy. This is a procedure to look into your stomach  using a thin, flexible tube that has a tiny camera and light on the end of it. How is this treated? This condition may be treated by:  Dietary and lifestyle changes to help reduce GERD symptoms.  Medicines. These may include: ? Over-the-counter antacids. ? Medicines that make your stomach empty more quickly. ? Medicines that block the production of stomach acid (H2 blockers). ? Stronger medicines to reduce stomach acid (proton pump inhibitors).  Surgery to repair the hernia, if other treatments are not helping. If you have no symptoms, you may not need treatment. Follow these instructions at home: Lifestyle and activity  Do not use any products that contain nicotine or tobacco, such as cigarettes and e-cigarettes. If you need help quitting, ask your health care provider.  Try to achieve and maintain a healthy body weight.  Avoid putting pressure on your abdomen. Anything that puts pressure on your abdomen increases the amount of acid that may be pushed up into your esophagus. ? Avoid bending over, especially after eating. ? Raise the head of your bed by putting blocks under the legs. This keeps your head and esophagus higher than your stomach. ? Do not wear tight clothing around your chest or stomach. ? Try not to strain when having a bowel movement, when urinating, or when lifting heavy objects. Eating and drinking  Avoid foods that can worsen GERD symptoms. These may include: ? Fatty foods, like fried foods. ? Citrus fruits, like oranges or lemon. ? Other foods and drinks that contain acid, like  orange juice or tomatoes. ? Spicy food. ? Chocolate.  Eat frequent small meals instead of three large meals a day. This helps prevent your stomach from getting too full. ? Eat slowly. ? Do not lie down right after eating. ? Do not eat 1-2 hours before bed.  Do not drink beverages with caffeine. These include cola, coffee, cocoa, and tea.  Do not drink alcohol. General  instructions  Take over-the-counter and prescription medicines only as told by your health care provider.  Keep all follow-up visits as told by your health care provider. This is important. Contact a health care provider if:  Your symptoms are not controlled with medicines or lifestyle changes.  You are having trouble swallowing.  You have coughing or wheezing that will not go away. Get help right away if:  Your pain is getting worse.  Your pain spreads to your arms, neck, jaw, teeth, or back.  You have shortness of breath.  You sweat for no reason.  You feel sick to your stomach (nauseous) or you vomit.  You vomit blood.  You have bright red blood in your stools.  You have black, tarry stools. This information is not intended to replace advice given to you by your health care provider. Make sure you discuss any questions you have with your health care provider. Document Revised: 05/12/2017 Document Reviewed: 01/02/2017 Elsevier Patient Education  [place].   Pulmonary Contusion, Adult A pulmonary contusion is a deep bruise to the lung. The bruise causes the lung tissue to swell and bleed into the area around it. The lung may not function properly if this happens. You may feel short of breath and have pain in your chest. What are the causes? This condition is usually caused by a chest injury. This may happen from:  A car crash.  A severe fall, especially from a great height.  Being near an explosion.  A sports injury.  A crush injury, such as from industrial or farming machinery.  A physical assault, especially if you are struck in the chest with a blunt object.  A penetrating injury. What are the signs or symptoms? Symptoms of this condition include:  Shortness of breath.  Fast breathing.  Fast heart rate.  Bruising of the chest.  Chest pain, especially when taking a deep breath.  Coughing.  Spitting up blood.  Fever. Symptoms may get  worse over the first 24-48 hours. How is this diagnosed? This condition may be diagnosed based on a physical exam by your health care provider. This may include:  Watching your breathing.  Listening to your lung sounds.  Checking your chest for bruising or swelling.  Checking you carefully for other injuries. You may also have tests, such as:  A pulse oximetry test to check your heart rate and blood oxygen level. This is done by putting a sensor on your finger.  An arterial blood gas test to check if your lungs are working properly.  Chest X-rays. You may have more than one X-ray because the lung injury may not show up for 1-2 days after the injury.  An ultrasound.  A CT scan. How is this treated? Treatment for this condition may include:  Taking pain medicine.  Doing breathing exercises, such as deep breathing and coughing on purpose to help avoid pneumonia.  Having oxygen therapy if you have a low oxygen level.  Staying in the hospital for observation to make sure your symptoms do not get worse.  Having surgery if  a blood vessel continues to bleed or if the lung has been punctured.  Having a tube placed in your throat and using a machine (ventilator) to help you breathe. This may be needed in severe cases. Follow these instructions at home: Medicines  Do not take aspirin for the first few days because taking aspirin may increase bruising.  Take over-the-counter and prescription medicines only as told by your health care provider.  Ask your health care provider if the medicine prescribed to you requires you to avoid driving or using heavy machinery. Activity  Ask your health care provider what activities are safe for you. Ask if it is safe for you to drive, go to work, go to school, and play sports.  Return to your normal activities as told by your health care provider. General instructions   Take deep breaths and cough to clear lung secretions.  Hold a pillow  to your chest if it hurts when you cough.  Do deep breathing exercises with an incentive spirometer as told by your health care provider.  Keep all follow-up visits as told by your health care provider. This is important. Contact a health care provider if:  Any of your symptoms worsen or do not get better. Get help right away if:  You have trouble breathing.  Your chest pain gets worse.  You cough up blood.  You start to wheeze.  Your lips or nail beds appear blue.  You feel dizzy or feel like you may faint.  You feel confused.  You have a fever. Summary  A pulmonary contusion is a deep bruise to the tissues of the lung. The lung may not function properly if this happens.  Your symptoms may get worse over the first 24-48 hours.  Take deep breaths and cough to clear lung secretions.  Ask your health care provider what activities are safe for you. Ask if it is safe for you to drive, go to work, go to school, and play sports. This information is not intended to replace advice given to you by your health care provider. Make sure you discuss any questions you have with your health care provider. Document Revised: 05/01/2018 Document Reviewed: 05/02/2018 Elsevier Patient Education  2020 [employer].

## 2019-09-05 ENCOUNTER — Ambulatory Visit: Payer: Self-pay | Admitting: General Surgery
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
## 2019-10-02 NOTE — Patient Instructions (Addendum)
DUE TO COVID-19 ONLY ONE VISITOR IS ALLOWED TO COME WITH YOU AND STAY IN THE WAITING ROOM ONLY DURING PRE OP AND PROCEDURE DAY OF SURGERY. THE 1 VISITOR MAY VISIT WITH YOU AFTER SURGERY IN YOUR PRIVATE ROOM DURING VISITING HOURS ONLY!  YOU NEED TO HAVE A COVID 19 TEST ON: 10/11/19 @ 9:30 am , THIS TEST MUST BE DONE BEFORE SURGERY, COME  [address], [place] , [address].  [facility]) ONCE YOUR COVID TEST IS COMPLETED, PLEASE BEGIN THE QUARANTINE INSTRUCTIONS AS OUTLINED IN YOUR HANDOUT.                Para March    Your procedure is scheduled on: 10/15/19   Report to [facility] Main  Entrance   Report to SHORT STAYat: 5:30 AM     Call this number if you have problems the morning of surgery [phone]    Remember: Do not eat food or drink liquids :After Midnight.   BRUSH YOUR TEETH MORNING OF SURGERY AND RINSE YOUR MOUTH OUT, NO CHEWING GUM CANDY OR MINTS.     Take these medicines the morning of surgery with A SIP OF WATER: PANTOPRAZOLE,CITALOPRAM.                                 You may not have any metal on your body including hair pins and              piercings  Do not wear jewelry, make-up, lotions, powders or perfumes, deodorant             Do not wear nail polish on your fingernails.  Do not shave  48 hours prior to surgery.               Do not bring valuables to the hospital. [facility] IS NOT             RESPONSIBLE   FOR VALUABLES.  Contacts, dentures or bridgework may not be worn into surgery.  Leave suitcase in the car. After surgery it may be brought to your room.     Patients discharged the day of surgery will not be allowed to drive home. IF YOU ARE HAVING SURGERY AND GOING HOME THE SAME DAY, YOU MUST HAVE AN ADULT TO DRIVE YOU HOME AND BE WITH YOU FOR 24 HOURS. YOU MAY GO HOME BY TAXI OR UBER OR ORTHERWISE, BUT AN ADULT MUST ACCOMPANY YOU HOME AND STAY WITH YOU FOR 24 HOURS.  Name and phone number of your driver:  Special  Instructions: N/A              Please read over the following fact sheets you were given: _____________________________________________________________________             [facility] - Preparing for Surgery Before surgery, you can play an important role.  Because skin is not sterile, your skin needs to be as free of germs as possible.  You can reduce the number of germs on your skin by washing with CHG (chlorahexidine gluconate) soap before surgery.  CHG is an antiseptic cleaner which kills germs and bonds with the skin to continue killing germs even after washing. Please DO NOT use if you have an allergy to CHG or antibacterial soaps.  If your skin becomes reddened/irritated stop using the CHG and inform your nurse when you arrive at Short Stay. Do not shave (  including legs and underarms) for at least 48 hours prior to the first CHG shower.  You may shave your face/neck. Please follow these instructions carefully:  1.  Shower with CHG Soap the night before surgery and the  morning of Surgery.  2.  If you choose to wash your hair, wash your hair first as usual with your  normal  shampoo.  3.  After you shampoo, rinse your hair and body thoroughly to remove the  shampoo.                           4.  Use CHG as you would any other liquid soap.  You can apply chg directly  to the skin and wash                       Gently with a scrungie or clean washcloth.  5.  Apply the CHG Soap to your body ONLY FROM THE NECK DOWN.   Do not use on face/ open                           Wound or open sores. Avoid contact with eyes, ears mouth and genitals (private parts).                       Wash face,  Genitals (private parts) with your normal soap.             6.  Wash thoroughly, paying special attention to the area where your surgery  will be performed.  7.  Thoroughly rinse your body with warm water from the neck down.  8.  DO NOT shower/wash with your normal soap after using and rinsing off  the CHG  Soap.                9.  Pat yourself dry with a clean towel.            10.  Wear clean pajamas.            11.  Place clean sheets on your bed the night of your first shower and do not  sleep with pets. Day of Surgery : Do not apply any lotions/deodorants the morning of surgery.  Please wear clean clothes to the hospital/surgery center.  FAILURE TO FOLLOW THESE INSTRUCTIONS MAY RESULT IN THE CANCELLATION OF YOUR SURGERY PATIENT SIGNATURE_________________________________  NURSE SIGNATURE__________________________________  ________________________________________________________________________

## 2019-10-03 ENCOUNTER — Encounter (HOSPITAL_COMMUNITY)
Admission: RE | Admit: 2019-10-03 | Discharge: 2019-10-03 | Disposition: A | Discharge status: Home / Self Care | Payer: BC Managed Care – PPO | Source: Ambulatory Visit | Attending: General Surgery | Admitting: General Surgery

## 2019-10-03 ENCOUNTER — Other Ambulatory Visit: Payer: Self-pay

## 2019-10-03 ENCOUNTER — Encounter (HOSPITAL_COMMUNITY): Payer: Self-pay

## 2019-10-03 DIAGNOSIS — Z01812 Encounter for preprocedural laboratory examination: Secondary | ICD-10-CM | POA: Diagnosis present

## 2019-10-03 LAB — CBC WITH DIFFERENTIAL/PLATELET
Abs Immature Granulocytes: 0.02 K/uL (ref 0.00–0.07)
Basophils Absolute: 0.1 K/uL (ref 0.0–0.1)
Basophils Relative: 1 %
Eosinophils Absolute: 0.3 K/uL (ref 0.0–0.5)
Eosinophils Relative: 3 %
HCT: 41 % (ref 36.0–46.0)
Hemoglobin: 12.7 g/dL (ref 12.0–15.0)
Immature Granulocytes: 0 %
Lymphocytes Relative: 36 %
Lymphs Abs: 3.1 K/uL (ref 0.7–4.0)
MCH: 23.2 pg — ABNORMAL LOW (ref 26.0–34.0)
MCHC: 31 g/dL (ref 30.0–36.0)
MCV: 74.8 fL — ABNORMAL LOW (ref 80.0–100.0)
Monocytes Absolute: 0.7 K/uL (ref 0.1–1.0)
Monocytes Relative: 8 %
Neutro Abs: 4.6 K/uL (ref 1.7–7.7)
Neutrophils Relative %: 52 %
Platelets: 259 K/uL (ref 150–400)
RBC: 5.48 MIL/uL — ABNORMAL HIGH (ref 3.87–5.11)
RDW: 17.5 % — ABNORMAL HIGH (ref 11.5–15.5)
WBC: 8.8 K/uL (ref 4.0–10.5)
nRBC: 0 % (ref 0.0–0.2)

## 2019-10-03 LAB — COMPREHENSIVE METABOLIC PANEL
ALT: 14 U/L (ref 0–44)
AST: 15 U/L (ref 15–41)
Albumin: 4 g/dL (ref 3.5–5.0)
Alkaline Phosphatase: 89 U/L (ref 38–126)
Anion gap: 6 (ref 5–15)
BUN: 11 mg/dL (ref 6–20)
CO2: 25 mmol/L (ref 22–32)
Calcium: 8.7 mg/dL — ABNORMAL LOW (ref 8.9–10.3)
Chloride: 105 mmol/L (ref 98–111)
Creatinine, Ser: 0.75 mg/dL (ref 0.44–1.00)
GFR calc Af Amer: >60 mL/min (ref >60)
GFR calc non Af Amer: >60 mL/min (ref >60)
Glucose, Bld: 89 mg/dL (ref 70–99)
Potassium: 4.4 mmol/L (ref 3.5–5.1)
Sodium: 136 mmol/L (ref 135–145)
Total Bilirubin: 0.7 mg/dL (ref 0.3–1.2)
Total Protein: 7.5 g/dL (ref 6.5–8.1)

## 2019-10-03 LAB — TYPE AND SCREEN
ABO/RH(D): B POS
Antibody Screen: NEGATIVE

## 2019-10-03 NOTE — Progress Notes (Signed)
PCP - Terri Piedra. [facility] Cardiologist -   Chest x-ray - 08/15/19. EPIC EKG - 08/15/19. EPIC Stress Test -  ECHO -  Cardiac Cath -   Sleep Study -  CPAP -   Fasting Blood Sugar -  Checks Blood Sugar _____ times a day  Blood Thinner Instructions: Aspirin Instructions: Last Dose:  Anesthesia review:   Patient denies shortness of breath, fever, cough and chest pain at PAT appointment   Patient verbalized understanding of instructions that were given to them at the PAT appointment. Patient was also instructed that they will need to review over the PAT instructions again at home before surgery.

## 2019-10-04 LAB — ABO/RH: ABO/RH(D): B POS

## 2019-10-11 ENCOUNTER — Other Ambulatory Visit (HOSPITAL_COMMUNITY)
Admission: RE | Admit: 2019-10-11 | Discharge: 2019-10-11 | Disposition: A | Discharge status: Home / Self Care | Payer: BC Managed Care – PPO | Source: Ambulatory Visit | Attending: General Surgery | Admitting: General Surgery

## 2019-10-11 DIAGNOSIS — Z20822 Contact with and (suspected) exposure to covid-19: Secondary | ICD-10-CM | POA: Insufficient documentation

## 2019-10-11 DIAGNOSIS — Z01812 Encounter for preprocedural laboratory examination: Secondary | ICD-10-CM | POA: Insufficient documentation

## 2019-10-12 LAB — SARS CORONAVIRUS 2 (TAT 6-24 HRS): SARS Coronavirus 2: NEGATIVE

## 2019-10-14 MED ORDER — BUPIVACAINE LIPOSOME 1.3 % IJ SUSP
20.0000 mL | INTRAMUSCULAR | Status: DC
  Filled 2019-10-14: qty 20

## 2019-10-14 NOTE — Anesthesia Preprocedure Evaluation (Addendum)
Anesthesia Evaluation  Patient identified by MRN, date of birth, ID band Patient awake    Reviewed: Allergy & Precautions, NPO status , Patient's Chart, lab work & pertinent test results  Airway Mallampati: III  TM Distance: >3 FB Neck ROM: Full    Dental no notable dental hx. (+) Teeth Intact, Dental Advisory Given   Pulmonary neg pulmonary ROS,    Pulmonary exam normal breath sounds clear to auscultation       Cardiovascular negative cardio ROS Normal cardiovascular exam Rhythm:Regular Rate:Normal     Neuro/Psych  Headaches, PSYCHIATRIC DISORDERS Depression    GI/Hepatic Neg liver ROS, hiatal hernia, GERD  Medicated and Controlled,Incarcerated hiatal hernia   Endo/Other  Obesity BMI 32  Renal/GU negative Renal ROS  negative genitourinary   Musculoskeletal negative musculoskeletal ROS (+)   Abdominal Normal abdominal exam  (+)   Peds  Hematology negative hematology ROS (+)   Anesthesia Other Findings Day of surgery medications reviewed with the patient.  Reproductive/Obstetrics negative OB ROS S/p tubal ligation                           Anesthesia Physical Anesthesia Plan  ASA: II  Anesthesia Plan: General   Post-op Pain Management:    Induction: Intravenous and Rapid sequence  PONV Risk Score and Plan: 4 or greater and Ondansetron, Dexamethasone, Midazolam, Scopolamine patch - Pre-op and Treatment may vary due to age or medical condition  Airway Management Planned: Oral ETT  Additional Equipment: None  Intra-op Plan:   Post-operative Plan: Extubation in OR  Informed Consent: I have reviewed the patients History and Physical, chart, labs and discussed the procedure including the risks, benefits and alternatives for the proposed anesthesia with the patient or authorized representative who has indicated his/her understanding and acceptance.     Dental advisory given  Plan  Discussed with: CRNA  Anesthesia Plan Comments:        Anesthesia Quick Evaluation

## 2019-10-15 ENCOUNTER — Encounter (HOSPITAL_COMMUNITY): Payer: Self-pay | Admitting: General Surgery

## 2019-10-15 ENCOUNTER — Ambulatory Visit (HOSPITAL_COMMUNITY): Payer: BC Managed Care – PPO | Admitting: Anesthesiology

## 2019-10-15 ENCOUNTER — Inpatient Hospital Stay (HOSPITAL_COMMUNITY)
Admission: AD | Admit: 2019-10-15 | Discharge: 2019-10-18 | DRG: 327 | Disposition: A | Discharge status: Home / Self Care | Payer: BC Managed Care – PPO | Attending: General Surgery | Admitting: General Surgery

## 2019-10-15 ENCOUNTER — Encounter (HOSPITAL_COMMUNITY)
Admission: AD | Disposition: A | Discharge status: Home / Self Care | Payer: Self-pay | Source: Home / Self Care | Attending: General Surgery

## 2019-10-15 ENCOUNTER — Other Ambulatory Visit: Payer: Self-pay

## 2019-10-15 DIAGNOSIS — Z79899 Other long term (current) drug therapy: Secondary | ICD-10-CM

## 2019-10-15 DIAGNOSIS — S27329A Contusion of lung, unspecified, initial encounter: Secondary | ICD-10-CM | POA: Diagnosis present

## 2019-10-15 DIAGNOSIS — F419 Anxiety disorder, unspecified: Secondary | ICD-10-CM | POA: Diagnosis present

## 2019-10-15 DIAGNOSIS — S27808A Other injury of diaphragm, initial encounter: Secondary | ICD-10-CM | POA: Diagnosis present

## 2019-10-15 DIAGNOSIS — K449 Diaphragmatic hernia without obstruction or gangrene: Principal | ICD-10-CM | POA: Diagnosis present

## 2019-10-15 DIAGNOSIS — Z9851 Tubal ligation status: Secondary | ICD-10-CM

## 2019-10-15 DIAGNOSIS — F329 Major depressive disorder, single episode, unspecified: Secondary | ICD-10-CM | POA: Diagnosis present

## 2019-10-15 DIAGNOSIS — R12 Heartburn: Secondary | ICD-10-CM | POA: Diagnosis present

## 2019-10-15 DIAGNOSIS — Z8719 Personal history of other diseases of the digestive system: Secondary | ICD-10-CM

## 2019-10-15 DIAGNOSIS — Z818 Family history of other mental and behavioral disorders: Secondary | ICD-10-CM

## 2019-10-15 DIAGNOSIS — Y92410 Unspecified street and highway as the place of occurrence of the external cause: Secondary | ICD-10-CM

## 2019-10-15 DIAGNOSIS — Z9889 Other specified postprocedural states: Secondary | ICD-10-CM

## 2019-10-15 HISTORY — PX: HIATAL HERNIA REPAIR: SHX195

## 2019-10-15 LAB — URINALYSIS, ROUTINE W REFLEX MICROSCOPIC
Bilirubin Urine: NEGATIVE
Glucose, UA: NEGATIVE mg/dL
Hgb urine dipstick: NEGATIVE
Ketones, ur: NEGATIVE mg/dL
Leukocytes,Ua: NEGATIVE
Nitrite: NEGATIVE
Protein, ur: 30 mg/dL — AB
Specific Gravity, Urine: 1.03 (ref 1.005–1.030)
pH: 6 (ref 5.0–8.0)

## 2019-10-15 LAB — PREGNANCY, URINE: Preg Test, Ur: NEGATIVE

## 2019-10-15 SURGERY — REPAIR, HERNIA, HIATAL, LAPAROSCOPIC
Anesthesia: General | Site: Abdomen

## 2019-10-15 MED ORDER — 0.9 % SODIUM CHLORIDE (POUR BTL) OPTIME
TOPICAL | Status: DC | PRN
  Administered 2019-10-15: 1000 mL

## 2019-10-15 MED ORDER — PROMETHAZINE HCL 25 MG/ML IJ SOLN: 6.2500 mg | INTRAMUSCULAR | Status: DC | PRN

## 2019-10-15 MED ORDER — ONDANSETRON HCL 4 MG/2ML IJ SOLN
4.0000 mg | Freq: Four times a day (QID) | INTRAMUSCULAR | Status: DC
  Administered 2019-10-15 – 2019-10-17 (×6): 4 mg via INTRAVENOUS
  Filled 2019-10-15 (×8): qty 2

## 2019-10-15 MED ORDER — BUPIVACAINE LIPOSOME 1.3 % IJ SUSP
INTRAMUSCULAR | Status: DC | PRN
  Administered 2019-10-15: 20 mL

## 2019-10-15 MED ORDER — ROCURONIUM BROMIDE 10 MG/ML (PF) SYRINGE
PREFILLED_SYRINGE | INTRAVENOUS | Status: AC
  Filled 2019-10-15: qty 10

## 2019-10-15 MED ORDER — PROPOFOL 10 MG/ML IV BOLUS
INTRAVENOUS | Status: AC
  Filled 2019-10-15: qty 20

## 2019-10-15 MED ORDER — PHENYLEPHRINE 40 MCG/ML (10ML) SYRINGE FOR IV PUSH (FOR BLOOD PRESSURE SUPPORT)
PREFILLED_SYRINGE | INTRAVENOUS | Status: AC
  Filled 2019-10-15: qty 20

## 2019-10-15 MED ORDER — HEPARIN SODIUM (PORCINE) 5000 UNIT/ML IJ SOLN
5000.0000 [IU] | Freq: Once | INTRAMUSCULAR | Status: AC
  Administered 2019-10-15: 5000 [IU] via SUBCUTANEOUS
  Filled 2019-10-15: qty 1

## 2019-10-15 MED ORDER — FENTANYL CITRATE (PF) 100 MCG/2ML IJ SOLN
INTRAMUSCULAR | Status: AC
  Filled 2019-10-15: qty 2

## 2019-10-15 MED ORDER — MIDAZOLAM HCL 2 MG/2ML IJ SOLN
INTRAMUSCULAR | Status: AC
  Filled 2019-10-15: qty 2

## 2019-10-15 MED ORDER — SIMETHICONE 80 MG PO CHEW: 40.0000 mg | CHEWABLE_TABLET | Freq: Four times a day (QID) | ORAL | Status: DC | PRN

## 2019-10-15 MED ORDER — MEPERIDINE HCL 50 MG/ML IJ SOLN: 6.2500 mg | INTRAMUSCULAR | Status: DC | PRN

## 2019-10-15 MED ORDER — LIDOCAINE 2% (20 MG/ML) 5 ML SYRINGE
INTRAMUSCULAR | Status: AC
  Filled 2019-10-15: qty 5

## 2019-10-15 MED ORDER — OXYCODONE HCL 5 MG PO TABS: 5.0000 mg | ORAL_TABLET | Freq: Once | ORAL | Status: AC | PRN

## 2019-10-15 MED ORDER — PANTOPRAZOLE SODIUM 40 MG IV SOLR
40.0000 mg | Freq: Every day | INTRAVENOUS | Status: DC
  Administered 2019-10-15 – 2019-10-17 (×3): 40 mg via INTRAVENOUS
  Filled 2019-10-15 (×3): qty 40

## 2019-10-15 MED ORDER — LIDOCAINE 2% (20 MG/ML) 5 ML SYRINGE
INTRAMUSCULAR | Status: DC | PRN
  Administered 2019-10-15: 60 mg via INTRAVENOUS

## 2019-10-15 MED ORDER — ONDANSETRON HCL 4 MG/2ML IJ SOLN
INTRAMUSCULAR | Status: DC | PRN
  Administered 2019-10-15: 4 mg via INTRAVENOUS

## 2019-10-15 MED ORDER — EPINEPHRINE PF 1 MG/ML IJ SOLN
INTRAMUSCULAR | Status: AC
  Filled 2019-10-15: qty 1

## 2019-10-15 MED ORDER — ACETAMINOPHEN 500 MG PO TABS
1000.0000 mg | ORAL_TABLET | Freq: Once | ORAL | Status: AC
  Filled 2019-10-15: qty 2

## 2019-10-15 MED ORDER — ROCURONIUM BROMIDE 10 MG/ML (PF) SYRINGE
PREFILLED_SYRINGE | INTRAVENOUS | Status: DC | PRN
  Administered 2019-10-15: 10 mg via INTRAVENOUS
  Administered 2019-10-15: 50 mg via INTRAVENOUS
  Administered 2019-10-15 (×2): 10 mg via INTRAVENOUS
  Administered 2019-10-15: 20 mg via INTRAVENOUS

## 2019-10-15 MED ORDER — GABAPENTIN 300 MG PO CAPS
300.0000 mg | ORAL_CAPSULE | Freq: Two times a day (BID) | ORAL | Status: DC
  Administered 2019-10-15: 300 mg via ORAL
  Filled 2019-10-15: qty 1

## 2019-10-15 MED ORDER — ACETAMINOPHEN 500 MG PO TABS
1000.0000 mg | ORAL_TABLET | Freq: Four times a day (QID) | ORAL | Status: DC
  Administered 2019-10-15 – 2019-10-18 (×7): 1000 mg via ORAL
  Filled 2019-10-15 (×8): qty 2

## 2019-10-15 MED ORDER — DIPHENHYDRAMINE HCL 50 MG/ML IJ SOLN: 12.5000 mg | Freq: Four times a day (QID) | INTRAMUSCULAR | Status: DC | PRN

## 2019-10-15 MED ORDER — LACTATED RINGERS IV SOLN: INTRAVENOUS | Status: DC | PRN

## 2019-10-15 MED ORDER — ENOXAPARIN SODIUM 40 MG/0.4ML ~~LOC~~ SOLN
40.0000 mg | SUBCUTANEOUS | Status: DC
  Administered 2019-10-16 – 2019-10-18 (×3): 40 mg via SUBCUTANEOUS
  Filled 2019-10-15 (×3): qty 0.4

## 2019-10-15 MED ORDER — DEXAMETHASONE SODIUM PHOSPHATE 10 MG/ML IJ SOLN
INTRAMUSCULAR | Status: AC
  Filled 2019-10-15: qty 1

## 2019-10-15 MED ORDER — LACTATED RINGERS IR SOLN
Status: DC | PRN
  Administered 2019-10-15: 1000 mL

## 2019-10-15 MED ORDER — EPHEDRINE SULFATE 50 MG/ML IJ SOLN
INTRAMUSCULAR | Status: DC | PRN
  Administered 2019-10-15 (×4): 5 mg via INTRAVENOUS

## 2019-10-15 MED ORDER — PHENYLEPHRINE HCL-NACL 10-0.9 MG/250ML-% IV SOLN
INTRAVENOUS | Status: DC | PRN
Start: 2019-10-15 — End: 2019-10-15
  Administered 2019-10-15: 40 ug/min via INTRAVENOUS

## 2019-10-15 MED ORDER — ALPRAZOLAM 0.5 MG PO TABS
0.5000 mg | ORAL_TABLET | Freq: Every day | ORAL | Status: DC
  Administered 2019-10-15 – 2019-10-17 (×3): 0.5 mg via ORAL
  Filled 2019-10-15 (×3): qty 1

## 2019-10-15 MED ORDER — HYDROMORPHONE HCL 1 MG/ML IJ SOLN
INTRAMUSCULAR | Status: AC
  Administered 2019-10-15: 0.5 mg via INTRAVENOUS
  Filled 2019-10-15: qty 2

## 2019-10-15 MED ORDER — KETOROLAC TROMETHAMINE 30 MG/ML IJ SOLN: 30.0000 mg | Freq: Once | INTRAMUSCULAR | Status: DC | PRN

## 2019-10-15 MED ORDER — PHENYLEPHRINE HCL (PRESSORS) 10 MG/ML IV SOLN
INTRAVENOUS | Status: AC
  Filled 2019-10-15: qty 1

## 2019-10-15 MED ORDER — EPHEDRINE 5 MG/ML INJ
INTRAVENOUS | Status: AC
  Filled 2019-10-15: qty 10

## 2019-10-15 MED ORDER — CHLORHEXIDINE GLUCONATE CLOTH 2 % EX PADS: 6.0000 | MEDICATED_PAD | Freq: Once | CUTANEOUS | Status: DC

## 2019-10-15 MED ORDER — SCOPOLAMINE 1 MG/3DAYS TD PT72
1.0000 | MEDICATED_PATCH | TRANSDERMAL | Status: DC
  Filled 2019-10-15: qty 1

## 2019-10-15 MED ORDER — SCOPOLAMINE 1 MG/3DAYS TD PT72
1.0000 | MEDICATED_PATCH | TRANSDERMAL | Status: DC
  Administered 2019-10-15: 1.5 mg via TRANSDERMAL

## 2019-10-15 MED ORDER — CITALOPRAM HYDROBROMIDE 20 MG PO TABS
40.0000 mg | ORAL_TABLET | Freq: Every day | ORAL | Status: DC
  Administered 2019-10-15 – 2019-10-17 (×3): 40 mg via ORAL
  Filled 2019-10-15 (×4): qty 2

## 2019-10-15 MED ORDER — SUCCINYLCHOLINE CHLORIDE 200 MG/10ML IV SOSY
PREFILLED_SYRINGE | INTRAVENOUS | Status: DC | PRN
  Administered 2019-10-15: 80 mg via INTRAVENOUS

## 2019-10-15 MED ORDER — LIDOCAINE 20MG/ML (2%) 15 ML SYRINGE OPTIME
INTRAMUSCULAR | Status: DC | PRN
Start: 2019-10-15 — End: 2019-10-15
  Administered 2019-10-15: 1.5 mg/kg/h via INTRAVENOUS

## 2019-10-15 MED ORDER — ACETAMINOPHEN 500 MG PO TABS
1000.0000 mg | ORAL_TABLET | ORAL | Status: AC
  Administered 2019-10-15: 1000 mg via ORAL

## 2019-10-15 MED ORDER — CEFAZOLIN SODIUM-DEXTROSE 2-4 GM/100ML-% IV SOLN
2.0000 g | INTRAVENOUS | Status: AC
  Administered 2019-10-15: 2 g via INTRAVENOUS
  Filled 2019-10-15: qty 100

## 2019-10-15 MED ORDER — SUCCINYLCHOLINE CHLORIDE 200 MG/10ML IV SOSY
PREFILLED_SYRINGE | INTRAVENOUS | Status: AC
  Filled 2019-10-15: qty 10

## 2019-10-15 MED ORDER — OXYCODONE HCL 5 MG/5ML PO SOLN: 5.0000 mg | Freq: Once | ORAL | Status: AC | PRN

## 2019-10-15 MED ORDER — PHENYLEPHRINE HCL (PRESSORS) 10 MG/ML IV SOLN
INTRAVENOUS | Status: DC | PRN
  Administered 2019-10-15 (×4): 80 ug via INTRAVENOUS
  Administered 2019-10-15: 120 ug via INTRAVENOUS

## 2019-10-15 MED ORDER — KETOROLAC TROMETHAMINE 30 MG/ML IJ SOLN
30.0000 mg | Freq: Three times a day (TID) | INTRAMUSCULAR | Status: AC
  Administered 2019-10-15 – 2019-10-17 (×6): 30 mg via INTRAVENOUS
  Filled 2019-10-15 (×6): qty 1

## 2019-10-15 MED ORDER — DIPHENHYDRAMINE HCL 12.5 MG/5ML PO ELIX: 12.5000 mg | ORAL_SOLUTION | Freq: Four times a day (QID) | ORAL | Status: DC | PRN

## 2019-10-15 MED ORDER — DEXAMETHASONE SODIUM PHOSPHATE 4 MG/ML IJ SOLN
4.0000 mg | INTRAMUSCULAR | Status: AC
  Administered 2019-10-15: 4 mg via INTRAVENOUS

## 2019-10-15 MED ORDER — DOCUSATE SODIUM 100 MG PO CAPS
100.0000 mg | ORAL_CAPSULE | Freq: Two times a day (BID) | ORAL | Status: DC
  Administered 2019-10-15 – 2019-10-18 (×5): 100 mg via ORAL
  Filled 2019-10-15 (×5): qty 1

## 2019-10-15 MED ORDER — OXYCODONE HCL 5 MG PO TABS
ORAL_TABLET | ORAL | Status: AC
  Administered 2019-10-15: 5 mg via ORAL
  Filled 2019-10-15: qty 1

## 2019-10-15 MED ORDER — PROPOFOL 10 MG/ML IV BOLUS
INTRAVENOUS | Status: DC | PRN
  Administered 2019-10-15: 200 mg via INTRAVENOUS

## 2019-10-15 MED ORDER — ONDANSETRON HCL 4 MG/2ML IJ SOLN
INTRAMUSCULAR | Status: AC
  Filled 2019-10-15: qty 2

## 2019-10-15 MED ORDER — HYDROMORPHONE HCL 1 MG/ML IJ SOLN
0.2500 mg | INTRAMUSCULAR | Status: DC | PRN
  Administered 2019-10-15 (×3): 0.5 mg via INTRAVENOUS

## 2019-10-15 MED ORDER — OXYCODONE HCL 5 MG PO TABS
5.0000 mg | ORAL_TABLET | ORAL | Status: DC | PRN
  Administered 2019-10-15: 5 mg via ORAL
  Filled 2019-10-15: qty 1

## 2019-10-15 MED ORDER — PROMETHAZINE HCL 25 MG/ML IJ SOLN: 12.5000 mg | Freq: Four times a day (QID) | INTRAMUSCULAR | Status: DC | PRN

## 2019-10-15 MED ORDER — BUPIVACAINE HCL 0.25 % IJ SOLN
INTRAMUSCULAR | Status: AC
  Filled 2019-10-15: qty 1

## 2019-10-15 MED ORDER — MORPHINE SULFATE (PF) 2 MG/ML IV SOLN
1.0000 mg | INTRAVENOUS | Status: DC | PRN
  Administered 2019-10-15: 1 mg via INTRAVENOUS
  Administered 2019-10-16 – 2019-10-18 (×7): 2 mg via INTRAVENOUS
  Filled 2019-10-15 (×8): qty 1

## 2019-10-15 MED ORDER — KCL IN DEXTROSE-NACL 20-5-0.45 MEQ/L-%-% IV SOLN
INTRAVENOUS | Status: DC
  Filled 2019-10-15 (×6): qty 1000

## 2019-10-15 MED ORDER — BUPIVACAINE HCL (PF) 0.25 % IJ SOLN
INTRAMUSCULAR | Status: DC | PRN
  Administered 2019-10-15: 30 mL

## 2019-10-15 MED ORDER — FENTANYL CITRATE (PF) 250 MCG/5ML IJ SOLN
INTRAMUSCULAR | Status: AC
  Filled 2019-10-15: qty 5

## 2019-10-15 MED ORDER — FENTANYL CITRATE (PF) 250 MCG/5ML IJ SOLN
INTRAMUSCULAR | Status: DC | PRN
  Administered 2019-10-15 (×3): 50 ug via INTRAVENOUS
  Administered 2019-10-15: 150 ug via INTRAVENOUS
  Administered 2019-10-15: 50 ug via INTRAVENOUS

## 2019-10-15 MED ORDER — GABAPENTIN 300 MG PO CAPS
300.0000 mg | ORAL_CAPSULE | ORAL | Status: AC
  Administered 2019-10-15: 300 mg via ORAL
  Filled 2019-10-15: qty 1

## 2019-10-15 MED ORDER — MIDAZOLAM HCL 5 MG/5ML IJ SOLN
INTRAMUSCULAR | Status: DC | PRN
  Administered 2019-10-15: 2 mg via INTRAVENOUS

## 2019-10-15 SURGICAL SUPPLY — 56 items
APL PRP STRL LF DISP 70% ISPRP (MISCELLANEOUS) ×1
APL SKNCLS STERI-STRIP NONHPOA (GAUZE/BANDAGES/DRESSINGS) ×1
APPLIER CLIP ROT 10 11.4 M/L (STAPLE)
APR CLP MED LRG 11.4X10 (STAPLE)
BENZOIN TINCTURE PRP APPL 2/3 (GAUZE/BANDAGES/DRESSINGS) ×2 IMPLANT
BNDG ADH 1X3 SHEER STRL LF (GAUZE/BANDAGES/DRESSINGS) ×2 IMPLANT
BNDG ADH THN 3X1 STRL LF (GAUZE/BANDAGES/DRESSINGS) ×1
CABLE HIGH FREQUENCY MONO STRZ (ELECTRODE) IMPLANT
CHLORAPREP W/TINT 26 (MISCELLANEOUS) ×3 IMPLANT
CLIP APPLIE ROT 10 11.4 M/L (STAPLE) IMPLANT
CLOSURE WOUND 1/2 X4 (GAUZE/BANDAGES/DRESSINGS) ×1
COVER WAND RF STERILE (DRAPES) IMPLANT
DEVICE SUT QUICK LOAD TK 5 (STAPLE) ×5 IMPLANT
DEVICE SUT TI-KNOT TK 5X26 (MISCELLANEOUS) ×1 IMPLANT
DEVICE SUTURE ENDOST 10MM (ENDOMECHANICALS) ×3 IMPLANT
DEVICE TI KNOT TK5 (MISCELLANEOUS) ×1
DISSECTOR BLUNT TIP ENDO 5MM (MISCELLANEOUS) ×3 IMPLANT
DRAIN PENROSE 0.5X18 (DRAIN) ×3 IMPLANT
ELECT L-HOOK LAP 45CM DISP (ELECTROSURGICAL) ×3
ELECT REM PT RETURN 15FT ADLT (MISCELLANEOUS) ×3 IMPLANT
ELECTRODE L-HOOK LAP 45CM DISP (ELECTROSURGICAL) ×1 IMPLANT
GLOVE BIO SURGEON STRL SZ7.5 (GLOVE) ×3 IMPLANT
GLOVE BIOGEL PI IND STRL 7.0 (GLOVE) IMPLANT
GLOVE BIOGEL PI INDICATOR 7.0 (GLOVE)
GLOVE INDICATOR 8.0 STRL GRN (GLOVE) ×3 IMPLANT
GOWN STRL REUS W/TWL LRG LVL3 (GOWN DISPOSABLE) ×3 IMPLANT
GOWN STRL REUS W/TWL XL LVL3 (GOWN DISPOSABLE) ×9 IMPLANT
GRASPER SUT TROCAR 14GX15 (MISCELLANEOUS) ×5 IMPLANT
KIT BASIN (CUSTOM PROCEDURE TRAY) ×3 IMPLANT
KIT TURNOVER KIT A (KITS) IMPLANT
MARKER SKIN DUAL TIP RULER LAB (MISCELLANEOUS) ×2 IMPLANT
NS IRRIG 1000ML POUR BTL (IV SOLUTION) ×3 IMPLANT
PACK UNIVERSAL I (CUSTOM PROCEDURE TRAY) ×3 IMPLANT
PENCIL SMOKE EVACUATOR (MISCELLANEOUS) IMPLANT
QUICK LOAD TK 5 (STAPLE) ×5
SCISSORS LAP 5X45 EPIX DISP (ENDOMECHANICALS) ×3 IMPLANT
SET IRRIG TUBING LAPAROSCOPIC (IRRIGATION / IRRIGATOR) ×3 IMPLANT
SET TUBE SMOKE EVAC HIGH FLOW (TUBING) ×3 IMPLANT
SHEARS HARMONIC ACE PLUS 45CM (MISCELLANEOUS) ×3 IMPLANT
SLEEVE XCEL OPT CAN 5 100 (ENDOMECHANICALS) ×9 IMPLANT
STRIP CLOSURE SKIN 1/2X4 (GAUZE/BANDAGES/DRESSINGS) ×1 IMPLANT
SUT ETHIBOND 2 0 SH (SUTURE) ×12
SUT ETHIBOND 2 0 SH 36X2 (SUTURE) ×3 IMPLANT
SUT MNCRL AB 4-0 PS2 18 (SUTURE) ×5 IMPLANT
SUT SURGIDAC NAB ES-9 0 48 120 (SUTURE) ×16 IMPLANT
TIP INNERVISION DETACH 40FR (MISCELLANEOUS) IMPLANT
TIP INNERVISION DETACH 50FR (MISCELLANEOUS) IMPLANT
TIP INNERVISION DETACH 56FR (MISCELLANEOUS) IMPLANT
TIPS INNERVISION DETACH 40FR (MISCELLANEOUS)
TOWEL OR 17X26 10 PK STRL BLUE (TOWEL DISPOSABLE) ×3 IMPLANT
TOWEL OR NON WOVEN STRL DISP B (DISPOSABLE) ×3 IMPLANT
TRAY FOLEY MTR SLVR 14FR STAT (SET/KITS/TRAYS/PACK) ×2 IMPLANT
TRAY LAPAROSCOPIC (CUSTOM PROCEDURE TRAY) ×3 IMPLANT
TROCAR BLADELESS OPT 5 100 (ENDOMECHANICALS) ×3 IMPLANT
TROCAR XCEL BLUNT TIP 100MML (ENDOMECHANICALS) IMPLANT
TROCAR XCEL NON-BLD 11X100MML (ENDOMECHANICALS) ×3 IMPLANT

## 2019-10-15 NOTE — Brief Op Note (Signed)
10/15/2019  10:11 AM  PATIENT:  Lisa Bates  29 y.o. female  PRE-OPERATIVE DIAGNOSIS:  TRAUMATIC HIATAL HERNIA  POST-OPERATIVE DIAGNOSIS:  TYPE III HIATAL HERNIA  PROCEDURE:  Procedure(s): LAPAROSCOPIC REPAIR HIATAL HERNIA WITH GASTROPEXY (N/A) LAPAROSCOPIC BILATERAL TAP BLOCK  SURGEON:  Surgeon(s) and Role:    Gaynelle Adu, MD - Primary    * Luretha Murphy, MD - Assisting  PHYSICIAN ASSISTANT:   ASSISTANTS: Luretha Murphy MD FACS    ANESTHESIA:   general  EBL:  50 mL   BLOOD ADMINISTERED:none  DRAINS: none   LOCAL MEDICATIONS USED:  MARCAINE    and OTHER exparel  SPECIMEN:  Source of Specimen:  portion of hernia sac  DISPOSITION OF SPECIMEN:  discarded  COUNTS:  YES  TOURNIQUET:  * No tourniquets in log *  DICTATION: .Other Dictation: Dictation Number [id number]  PLAN OF CARE: Admit for overnight observation  PATIENT DISPOSITION:  PACU - hemodynamically stable.   Delay start of Pharmacological VTE agent (>24hrs) due to surgical blood loss or risk of bleeding: no  [name]. Andrey Campanile, MD, FACS General, Bariatric, & Minimally Invasive Surgery [facility], [place]
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
## 2019-10-15 NOTE — Anesthesia Procedure Notes (Signed)
Procedure Name: Intubation Date/Time: 10/15/2019 7:35 AM Performed by: Anne Fu, CRNA Pre-anesthesia Checklist: Patient identified, Emergency Drugs available, Suction available, Patient being monitored and Timeout performed Patient Re-evaluated:Patient Re-evaluated prior to induction Oxygen Delivery Method: Circle system utilized Preoxygenation: Pre-oxygenation with 100% oxygen Induction Type: IV induction, Cricoid Pressure applied and Rapid sequence Laryngoscope Size: Mac and 4 Grade View: Grade II Tube type: Oral Tube size: 7.5 mm Number of attempts: 1 Airway Equipment and Method: Stylet Placement Confirmation: ETT inserted through vocal cords under direct vision,  positive ETCO2 and breath sounds checked- equal and bilateral Secured at: 21 cm Tube secured with: Tape Dental Injury: Teeth and Oropharynx as per pre-operative assessment

## 2019-10-15 NOTE — Anesthesia Postprocedure Evaluation (Signed)
Anesthesia Post Note  Patient: LAYAH SKOUSEN  Procedure(s) Performed: LAPAROSCOPIC REPAIR HIATAL HERNIA WITH GASTROPEXY (N/A Abdomen)     Patient location during evaluation: PACU Anesthesia Type: General Level of consciousness: awake and alert, oriented and patient cooperative Pain management: pain level controlled Vital Signs Assessment: post-procedure vital signs reviewed and stable Respiratory status: spontaneous breathing, nonlabored ventilation and respiratory function stable Cardiovascular status: blood pressure returned to baseline and stable Postop Assessment: no apparent nausea or vomiting Anesthetic complications: no    Last Vitals:  Vitals:   10/15/19 1238 10/15/19 1322  BP: 115/83 127/90  Pulse: (!) 107 (!) 106  Resp: 15 17  Temp: 37.2 C 36.4 C  SpO2: 97% 96%    Last Pain:  Vitals:   10/15/19 1322  TempSrc: Oral  PainSc:                  Lannie Fields
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
## 2019-10-15 NOTE — Transfer of Care (Signed)
Immediate Anesthesia Transfer of Care Note  Patient: Lisa Bates  Procedure(s) Performed: Procedure(s): LAPAROSCOPIC REPAIR HIATAL HERNIA WITH GASTROPEXY (N/A)  Patient Location: PACU  Anesthesia Type:General  Level of Consciousness:  sedated, patient cooperative and responds to stimulation  Airway & Oxygen Therapy:Patient Spontanous Breathing and Patient connected to face mask oxgen  Post-op Assessment:  Report given to PACU RN and Post -op Vital signs reviewed and stable  Post vital signs:  Reviewed and stable  Last Vitals:  Vitals:   10/15/19 0551 10/15/19 1013  BP: 132/87 139/85  Pulse: 73 (!) 103  Resp: 18   Temp: 36.8 C 37 C  SpO2: 96% 99%    Complications: No apparent anesthesia complications

## 2019-10-16 ENCOUNTER — Observation Stay (HOSPITAL_COMMUNITY): Payer: BC Managed Care – PPO

## 2019-10-16 LAB — CBC
HCT: 35.5 % — ABNORMAL LOW (ref 36.0–46.0)
Hemoglobin: 10.9 g/dL — ABNORMAL LOW (ref 12.0–15.0)
MCH: 23.6 pg — ABNORMAL LOW (ref 26.0–34.0)
MCHC: 30.7 g/dL (ref 30.0–36.0)
MCV: 77 fL — ABNORMAL LOW (ref 80.0–100.0)
Platelets: 219 10*3/uL (ref 150–400)
RBC: 4.61 MIL/uL (ref 3.87–5.11)
RDW: 16.2 % — ABNORMAL HIGH (ref 11.5–15.5)
WBC: 12.1 10*3/uL — ABNORMAL HIGH (ref 4.0–10.5)
nRBC: 0 % (ref 0.0–0.2)

## 2019-10-16 LAB — BASIC METABOLIC PANEL
Anion gap: 8 (ref 5–15)
BUN: 7 mg/dL (ref 6–20)
CO2: 26 mmol/L (ref 22–32)
Calcium: 8.1 mg/dL — ABNORMAL LOW (ref 8.9–10.3)
Chloride: 105 mmol/L (ref 98–111)
Creatinine, Ser: 0.73 mg/dL (ref 0.44–1.00)
GFR calc Af Amer: >60 mL/min (ref >60)
GFR calc non Af Amer: >60 mL/min (ref >60)
Glucose, Bld: 121 mg/dL — ABNORMAL HIGH (ref 70–99)
Potassium: 4.1 mmol/L (ref 3.5–5.1)
Sodium: 139 mmol/L (ref 135–145)

## 2019-10-16 LAB — URINE CULTURE: Culture: NO GROWTH

## 2019-10-16 MED ORDER — IOHEXOL 300 MG/ML  SOLN
100.0000 mL | Freq: Once | INTRAMUSCULAR | Status: AC | PRN
  Administered 2019-10-16: 100 mL via INTRAVENOUS

## 2019-10-16 MED ORDER — GABAPENTIN 250 MG/5ML PO SOLN
300.0000 mg | Freq: Two times a day (BID) | ORAL | Status: DC
  Administered 2019-10-16 – 2019-10-18 (×4): 300 mg via ORAL
  Filled 2019-10-16 (×5): qty 6

## 2019-10-16 MED ORDER — SODIUM CHLORIDE (PF) 0.9 % IJ SOLN
INTRAMUSCULAR | Status: AC
  Filled 2019-10-16: qty 50

## 2019-10-16 MED ORDER — CEFAZOLIN SODIUM-DEXTROSE 2-4 GM/100ML-% IV SOLN
2.0000 g | INTRAVENOUS | Status: AC
  Administered 2019-10-17: 2 g via INTRAVENOUS
  Filled 2019-10-16: qty 100

## 2019-10-16 MED ORDER — OXYCODONE HCL 5 MG/5ML PO SOLN
5.0000 mg | Freq: Four times a day (QID) | ORAL | Status: DC | PRN
  Administered 2019-10-18 (×2): 5 mg via ORAL
  Filled 2019-10-16 (×3): qty 5

## 2019-10-16 NOTE — Progress Notes (Signed)
1 Day Post-Op   Subjective/Chief Complaint: Not much nausea Some difficulty with pills - sensation of little stickiness Did some clears Walked in room   Objective: Vital signs in last 24 hours: Temp:  [97.6 F (36.4 C)-98.9 F (37.2 C)] 98.2 F (36.8 C) (05/05 1962) Pulse Rate:  [71-120] 71 (05/05 0632) Resp:  [10-24] 16 (05/05 2297) BP: (100-139)/(63-97) 106/88 (05/05 9892) SpO2:  [83 %-100 %] 99 % (05/05 1194) Last BM Date: 10/14/19  Intake/Output from previous day: 05/04 0701 - 05/05 0700 In: 2513.3 [P.O.:120; I.V.:2293.3; IV Piggyback:100] Out: 1150 [Urine:1100; Blood:50] Intake/Output this shift: No intake/output data recorded.  Alert, nontoxic cta Reg Soft, incisions ok, approp TTP  Lab Results:  Recent Labs    10/16/19 0424  WBC 12.1*  HGB 10.9*  HCT 35.5*  PLT 219   BMET Recent Labs    10/16/19 0424  NA 139  K 4.1  CL 105  CO2 26  GLUCOSE 121*  BUN 7  CREATININE 0.73  CALCIUM 8.1*   PT/INR No results for input(s): LABPROT, INR in the last 72 hours. ABG No results for input(s): PHART, HCO3 in the last 72 hours.  Invalid input(s): PCO2, PO2  Studies/Results: No results found.  Anti-infectives: Anti-infectives (From admission, onward)   Start     Dose/Rate Route Frequency Ordered Stop   10/15/19 0600  ceFAZolin (ANCEF) IVPB 2g/100 mL premix     2 g 200 mL/hr over 30 Minutes Intravenous On call to O.R. 10/15/19 1740 10/15/19 0738      Assessment/Plan: s/p Procedure(s): LAPAROSCOPIC REPAIR HIATAL HERNIA WITH GASTROPEXY (N/A)  No fever. No tachycardia. Small bump in wbc not uncommon Cont clears, hopefully adv to FLD later this am Check UGI this am Cont chemical vte prophylaxis Will switch some meds to liquids Discussed post discharge diet progression and eating tips/techniques Discussed intraop findings  Lisa Bates. Andrey Campanile, MD, FACS General, Bariatric, & Minimally Invasive Surgery [facility], [place]   LOS: 0 days     Lisa Bates 10/16/2019
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
## 2019-10-17 ENCOUNTER — Inpatient Hospital Stay (HOSPITAL_COMMUNITY): Payer: BC Managed Care – PPO | Admitting: Certified Registered Nurse Anesthetist

## 2019-10-17 ENCOUNTER — Encounter (HOSPITAL_COMMUNITY): Payer: Self-pay | Admitting: General Surgery

## 2019-10-17 ENCOUNTER — Encounter (HOSPITAL_COMMUNITY)
Admission: AD | Disposition: A | Discharge status: Home / Self Care | Payer: Self-pay | Source: Home / Self Care | Attending: General Surgery

## 2019-10-17 DIAGNOSIS — F419 Anxiety disorder, unspecified: Secondary | ICD-10-CM | POA: Diagnosis present

## 2019-10-17 DIAGNOSIS — Y92410 Unspecified street and highway as the place of occurrence of the external cause: Secondary | ICD-10-CM | POA: Diagnosis not present

## 2019-10-17 DIAGNOSIS — R12 Heartburn: Secondary | ICD-10-CM | POA: Diagnosis present

## 2019-10-17 DIAGNOSIS — K449 Diaphragmatic hernia without obstruction or gangrene: Secondary | ICD-10-CM | POA: Diagnosis present

## 2019-10-17 DIAGNOSIS — Z9851 Tubal ligation status: Secondary | ICD-10-CM | POA: Diagnosis not present

## 2019-10-17 DIAGNOSIS — S27329A Contusion of lung, unspecified, initial encounter: Secondary | ICD-10-CM | POA: Diagnosis present

## 2019-10-17 DIAGNOSIS — S27808A Other injury of diaphragm, initial encounter: Secondary | ICD-10-CM | POA: Diagnosis present

## 2019-10-17 DIAGNOSIS — Z79899 Other long term (current) drug therapy: Secondary | ICD-10-CM | POA: Diagnosis not present

## 2019-10-17 DIAGNOSIS — F329 Major depressive disorder, single episode, unspecified: Secondary | ICD-10-CM | POA: Diagnosis present

## 2019-10-17 DIAGNOSIS — Z818 Family history of other mental and behavioral disorders: Secondary | ICD-10-CM | POA: Diagnosis not present

## 2019-10-17 HISTORY — PX: HIATAL HERNIA REPAIR: SHX195

## 2019-10-17 LAB — CBC
HCT: 35.4 % — ABNORMAL LOW (ref 36.0–46.0)
Hemoglobin: 10.5 g/dL — ABNORMAL LOW (ref 12.0–15.0)
MCH: 23.5 pg — ABNORMAL LOW (ref 26.0–34.0)
MCHC: 29.7 g/dL — ABNORMAL LOW (ref 30.0–36.0)
MCV: 79.2 fL — ABNORMAL LOW (ref 80.0–100.0)
Platelets: 210 10*3/uL (ref 150–400)
RBC: 4.47 MIL/uL (ref 3.87–5.11)
RDW: 16.2 % — ABNORMAL HIGH (ref 11.5–15.5)
WBC: 8.4 10*3/uL (ref 4.0–10.5)
nRBC: 0 % (ref 0.0–0.2)

## 2019-10-17 LAB — BASIC METABOLIC PANEL
Anion gap: 6 (ref 5–15)
BUN: 5 mg/dL — ABNORMAL LOW (ref 6–20)
CO2: 28 mmol/L (ref 22–32)
Calcium: 8.1 mg/dL — ABNORMAL LOW (ref 8.9–10.3)
Chloride: 105 mmol/L (ref 98–111)
Creatinine, Ser: 0.82 mg/dL (ref 0.44–1.00)
GFR calc Af Amer: >60 mL/min (ref >60)
GFR calc non Af Amer: >60 mL/min (ref >60)
Glucose, Bld: 87 mg/dL (ref 70–99)
Potassium: 4.3 mmol/L (ref 3.5–5.1)
Sodium: 139 mmol/L (ref 135–145)

## 2019-10-17 LAB — TYPE AND SCREEN
ABO/RH(D): B POS
Antibody Screen: NEGATIVE

## 2019-10-17 SURGERY — REPAIR, HERNIA, HIATAL, LAPAROSCOPIC
Anesthesia: General

## 2019-10-17 MED ORDER — KETOROLAC TROMETHAMINE 30 MG/ML IJ SOLN
INTRAMUSCULAR | Status: DC | PRN
Start: 2019-10-17 — End: 2019-10-17
  Administered 2019-10-17: 30 mg via INTRAVENOUS

## 2019-10-17 MED ORDER — EPHEDRINE 5 MG/ML INJ
INTRAVENOUS | Status: AC
  Filled 2019-10-17: qty 10

## 2019-10-17 MED ORDER — LIDOCAINE 2% (20 MG/ML) 5 ML SYRINGE
INTRAMUSCULAR | Status: DC | PRN
  Administered 2019-10-17: 80 mg via INTRAVENOUS

## 2019-10-17 MED ORDER — KETOROLAC TROMETHAMINE 30 MG/ML IJ SOLN
INTRAMUSCULAR | Status: AC
  Filled 2019-10-17: qty 1

## 2019-10-17 MED ORDER — SUGAMMADEX SODIUM 200 MG/2ML IV SOLN
INTRAVENOUS | Status: DC | PRN
  Administered 2019-10-17: 200 mg via INTRAVENOUS

## 2019-10-17 MED ORDER — ONDANSETRON HCL 4 MG/2ML IJ SOLN: 4.0000 mg | Freq: Four times a day (QID) | INTRAMUSCULAR | Status: DC | PRN

## 2019-10-17 MED ORDER — DIPHENHYDRAMINE HCL 50 MG/ML IJ SOLN
INTRAMUSCULAR | Status: AC
  Filled 2019-10-17: qty 1

## 2019-10-17 MED ORDER — LIDOCAINE 2% (20 MG/ML) 5 ML SYRINGE
INTRAMUSCULAR | Status: DC | PRN
  Administered 2019-10-17: 1.5 mg/kg/h via INTRAVENOUS

## 2019-10-17 MED ORDER — PHENYLEPHRINE 40 MCG/ML (10ML) SYRINGE FOR IV PUSH (FOR BLOOD PRESSURE SUPPORT)
PREFILLED_SYRINGE | INTRAVENOUS | Status: AC
  Filled 2019-10-17: qty 10

## 2019-10-17 MED ORDER — SUCCINYLCHOLINE CHLORIDE 200 MG/10ML IV SOSY
PREFILLED_SYRINGE | INTRAVENOUS | Status: AC
  Filled 2019-10-17: qty 10

## 2019-10-17 MED ORDER — PHENYLEPHRINE HCL (PRESSORS) 10 MG/ML IV SOLN
INTRAVENOUS | Status: AC
  Filled 2019-10-17: qty 1

## 2019-10-17 MED ORDER — SODIUM CHLORIDE 0.9 % IR SOLN
Status: DC | PRN
  Administered 2019-10-17: 1000 mL

## 2019-10-17 MED ORDER — MIDAZOLAM HCL 2 MG/2ML IJ SOLN
INTRAMUSCULAR | Status: AC
  Filled 2019-10-17: qty 2

## 2019-10-17 MED ORDER — PROPOFOL 10 MG/ML IV BOLUS
INTRAVENOUS | Status: AC
  Filled 2019-10-17: qty 20

## 2019-10-17 MED ORDER — ONDANSETRON HCL 4 MG/2ML IJ SOLN
INTRAMUSCULAR | Status: AC
  Filled 2019-10-17: qty 2

## 2019-10-17 MED ORDER — FENTANYL CITRATE (PF) 250 MCG/5ML IJ SOLN
INTRAMUSCULAR | Status: AC
  Filled 2019-10-17: qty 5

## 2019-10-17 MED ORDER — SUCCINYLCHOLINE CHLORIDE 200 MG/10ML IV SOSY
PREFILLED_SYRINGE | INTRAVENOUS | Status: DC | PRN
  Administered 2019-10-17: 120 mg via INTRAVENOUS

## 2019-10-17 MED ORDER — ROCURONIUM BROMIDE 10 MG/ML (PF) SYRINGE
PREFILLED_SYRINGE | INTRAVENOUS | Status: AC
  Filled 2019-10-17: qty 10

## 2019-10-17 MED ORDER — ONDANSETRON HCL 4 MG/2ML IJ SOLN
INTRAMUSCULAR | Status: DC | PRN
  Administered 2019-10-17: 4 mg via INTRAVENOUS

## 2019-10-17 MED ORDER — PROPOFOL 10 MG/ML IV BOLUS
INTRAVENOUS | Status: DC | PRN
  Administered 2019-10-17: 170 mg via INTRAVENOUS

## 2019-10-17 MED ORDER — KETOROLAC TROMETHAMINE 30 MG/ML IJ SOLN
30.0000 mg | Freq: Three times a day (TID) | INTRAMUSCULAR | Status: DC
  Administered 2019-10-17 – 2019-10-18 (×3): 30 mg via INTRAVENOUS
  Filled 2019-10-17 (×3): qty 1

## 2019-10-17 MED ORDER — FENTANYL CITRATE (PF) 100 MCG/2ML IJ SOLN
INTRAMUSCULAR | Status: DC | PRN
  Administered 2019-10-17 (×5): 50 ug via INTRAVENOUS
  Administered 2019-10-17: 100 ug via INTRAVENOUS

## 2019-10-17 MED ORDER — DEXAMETHASONE SODIUM PHOSPHATE 10 MG/ML IJ SOLN
INTRAMUSCULAR | Status: DC | PRN
  Administered 2019-10-17: 8 mg via INTRAVENOUS

## 2019-10-17 MED ORDER — MIDAZOLAM HCL 5 MG/5ML IJ SOLN
INTRAMUSCULAR | Status: DC | PRN
  Administered 2019-10-17: 2 mg via INTRAVENOUS

## 2019-10-17 MED ORDER — LACTATED RINGERS IR SOLN
Status: DC | PRN
  Administered 2019-10-17: 1000 mL

## 2019-10-17 MED ORDER — ONDANSETRON HCL 4 MG/2ML IJ SOLN
4.0000 mg | Freq: Four times a day (QID) | INTRAMUSCULAR | Status: DC
  Administered 2019-10-17 – 2019-10-18 (×4): 4 mg via INTRAVENOUS
  Filled 2019-10-17 (×4): qty 2

## 2019-10-17 MED ORDER — FENTANYL CITRATE (PF) 100 MCG/2ML IJ SOLN
INTRAMUSCULAR | Status: AC
  Filled 2019-10-17: qty 2

## 2019-10-17 MED ORDER — FENTANYL CITRATE (PF) 100 MCG/2ML IJ SOLN
25.0000 ug | INTRAMUSCULAR | Status: DC | PRN
  Administered 2019-10-17: 50 ug via INTRAVENOUS

## 2019-10-17 MED ORDER — BUPIVACAINE-EPINEPHRINE 0.5% -1:200000 IJ SOLN
INTRAMUSCULAR | Status: AC
  Filled 2019-10-17: qty 1

## 2019-10-17 MED ORDER — FENTANYL CITRATE (PF) 100 MCG/2ML IJ SOLN
INTRAMUSCULAR | Status: AC
  Filled 2019-10-17: qty 4

## 2019-10-17 MED ORDER — LIDOCAINE 2% (20 MG/ML) 5 ML SYRINGE
INTRAMUSCULAR | Status: AC
  Filled 2019-10-17: qty 10

## 2019-10-17 MED ORDER — LIDOCAINE 2% (20 MG/ML) 5 ML SYRINGE
INTRAMUSCULAR | Status: AC
  Filled 2019-10-17: qty 5

## 2019-10-17 MED ORDER — ACETAMINOPHEN 500 MG PO TABS: 1000.0000 mg | ORAL_TABLET | Freq: Once | ORAL | Status: DC

## 2019-10-17 MED ORDER — DIPHENHYDRAMINE HCL 50 MG/ML IJ SOLN
INTRAMUSCULAR | Status: DC | PRN
Start: 2019-10-17 — End: 2019-10-17
  Administered 2019-10-17: 12.5 mg via INTRAVENOUS

## 2019-10-17 MED ORDER — ROCURONIUM BROMIDE 10 MG/ML (PF) SYRINGE
PREFILLED_SYRINGE | INTRAVENOUS | Status: DC | PRN
  Administered 2019-10-17: 10 mg via INTRAVENOUS
  Administered 2019-10-17: 50 mg via INTRAVENOUS
  Administered 2019-10-17: 10 mg via INTRAVENOUS
  Administered 2019-10-17: 20 mg via INTRAVENOUS

## 2019-10-17 MED ORDER — LACTATED RINGERS IV SOLN: INTRAVENOUS | Status: DC

## 2019-10-17 MED ORDER — PHENYLEPHRINE HCL-NACL 10-0.9 MG/250ML-% IV SOLN
INTRAVENOUS | Status: DC | PRN
  Administered 2019-10-17: 40 ug/min via INTRAVENOUS

## 2019-10-17 MED ORDER — EPHEDRINE SULFATE-NACL 50-0.9 MG/10ML-% IV SOSY
PREFILLED_SYRINGE | INTRAVENOUS | Status: DC | PRN
  Administered 2019-10-17 (×2): 5 mg via INTRAVENOUS

## 2019-10-17 SURGICAL SUPPLY — 60 items
APL PRP STRL LF DISP 70% ISPRP (MISCELLANEOUS) ×2
APL SKNCLS STERI-STRIP NONHPOA (GAUZE/BANDAGES/DRESSINGS)
APPLIER CLIP ROT 10 11.4 M/L (STAPLE) ×3
APPLIER CLIP ROT 13.4 12 LRG (CLIP)
APR CLP LRG 13.4X12 ROT 20 MLT (CLIP)
APR CLP MED LRG 11.4X10 (STAPLE) ×1
BENZOIN TINCTURE PRP APPL 2/3 (GAUZE/BANDAGES/DRESSINGS) IMPLANT
CABLE HIGH FREQUENCY MONO STRZ (ELECTRODE) IMPLANT
CHLORAPREP W/TINT 26 (MISCELLANEOUS) ×5 IMPLANT
CLIP APPLIE ROT 10 11.4 M/L (STAPLE) IMPLANT
CLIP APPLIE ROT 13.4 12 LRG (CLIP) IMPLANT
CLOSURE WOUND 1/2 X4 (GAUZE/BANDAGES/DRESSINGS)
COVER WAND RF STERILE (DRAPES) IMPLANT
DECANTER SPIKE VIAL GLASS SM (MISCELLANEOUS) ×1 IMPLANT
DEVICE SUT QUICK LOAD TK 5 (STAPLE) ×14 IMPLANT
DEVICE SUT TI-KNOT TK 5X26 (MISCELLANEOUS) ×1 IMPLANT
DEVICE SUTURE ENDOST 10MM (ENDOMECHANICALS) ×3 IMPLANT
DEVICE TI KNOT TK5 (MISCELLANEOUS) ×1
DISSECTOR BLUNT TIP ENDO 5MM (MISCELLANEOUS) ×3 IMPLANT
DRAIN PENROSE 0.5X18 (DRAIN) ×3 IMPLANT
ELECT L-HOOK LAP 45CM DISP (ELECTROSURGICAL)
ELECT REM PT RETURN 15FT ADLT (MISCELLANEOUS) ×3 IMPLANT
ELECTRODE L-HOOK LAP 45CM DISP (ELECTROSURGICAL) ×1 IMPLANT
GLOVE BIO SURGEON STRL SZ7.5 (GLOVE) ×3 IMPLANT
GLOVE BIOGEL PI IND STRL 7.0 (GLOVE) IMPLANT
GLOVE BIOGEL PI INDICATOR 7.0 (GLOVE)
GLOVE INDICATOR 8.0 STRL GRN (GLOVE) ×3 IMPLANT
GOWN STRL REUS W/TWL LRG LVL3 (GOWN DISPOSABLE) ×1 IMPLANT
GOWN STRL REUS W/TWL XL LVL3 (GOWN DISPOSABLE) ×11 IMPLANT
GRASPER SUT TROCAR 14GX15 (MISCELLANEOUS) ×3 IMPLANT
KIT BASIN (CUSTOM PROCEDURE TRAY) ×3 IMPLANT
KIT TURNOVER KIT A (KITS) IMPLANT
NS IRRIG 1000ML POUR BTL (IV SOLUTION) ×3 IMPLANT
PACK UNIVERSAL I (CUSTOM PROCEDURE TRAY) ×3 IMPLANT
PENCIL SMOKE EVACUATOR (MISCELLANEOUS) IMPLANT
QUICK LOAD TK 5 (STAPLE) ×14
SCISSORS LAP 5X45 EPIX DISP (ENDOMECHANICALS) ×3 IMPLANT
SET IRRIG TUBING LAPAROSCOPIC (IRRIGATION / IRRIGATOR) ×3 IMPLANT
SET TUBE SMOKE EVAC HIGH FLOW (TUBING) ×3 IMPLANT
SHEARS HARMONIC ACE PLUS 45CM (MISCELLANEOUS) ×3 IMPLANT
SLEEVE XCEL OPT CAN 5 100 (ENDOMECHANICALS) ×9 IMPLANT
SOL ANTI FOG 6CC (MISCELLANEOUS) IMPLANT
SOLUTION ANTI FOG 6CC (MISCELLANEOUS) ×2
STRIP CLOSURE SKIN 1/2X4 (GAUZE/BANDAGES/DRESSINGS) IMPLANT
SUT ETHIBOND 2 0 SH (SUTURE)
SUT ETHIBOND 2 0 SH 36X2 (SUTURE) ×3 IMPLANT
SUT MNCRL AB 4-0 PS2 18 (SUTURE) ×3 IMPLANT
SUT SURGIDAC NAB ES-9 0 48 120 (SUTURE) ×32 IMPLANT
SUT VICRYL 0 UR6 27IN ABS (SUTURE) ×2 IMPLANT
TIP INNERVISION DETACH 40FR (MISCELLANEOUS) IMPLANT
TIP INNERVISION DETACH 50FR (MISCELLANEOUS) IMPLANT
TIP INNERVISION DETACH 56FR (MISCELLANEOUS) ×2 IMPLANT
TIPS INNERVISION DETACH 40FR (MISCELLANEOUS)
TOWEL OR 17X26 10 PK STRL BLUE (TOWEL DISPOSABLE) ×3 IMPLANT
TOWEL OR NON WOVEN STRL DISP B (DISPOSABLE) ×2 IMPLANT
TRAY FOLEY MTR SLVR 16FR STAT (SET/KITS/TRAYS/PACK) ×1 IMPLANT
TRAY LAPAROSCOPIC (CUSTOM PROCEDURE TRAY) ×3 IMPLANT
TROCAR BLADELESS OPT 5 100 (ENDOMECHANICALS) ×3 IMPLANT
TROCAR XCEL BLUNT TIP 100MML (ENDOMECHANICALS) IMPLANT
TROCAR XCEL NON-BLD 11X100MML (ENDOMECHANICALS) ×3 IMPLANT

## 2019-10-17 NOTE — Op Note (Signed)
Lisa Bates [id number] 23-Jun-1990 10/17/2019  Preoperative diagnosis: recurrent mixed hiatal hernia  Postoperative diagnosis: Same   Procedure: Upper endoscopy   Surgeon: Susy Frizzle B. Daphine Deutscher  M.D., FACS   Anesthesia: Gen.   Indications for procedure: This patient was undergoing a redo mobilization of the esophagus for correction of a recurrent hiatal hernia.    Description of procedure: The endoscopy was placed in the mouth and into the oropharynx and under endoscopic vision it was advanced to the esophagogastric junction. Esophageal tortuosity noted.  The EG junction was right at the level of the crurae.   The pouch was insufflated and the stomach inspected.  The scope was withdrawn.  After the partial wrap was done and the hiatus was closed, I passed the scope a second time and verified the position of the partial wrap on retroflexion.   No bleeding or leaks were detected.  The scope was withdrawn without difficulty.     Matt B. Daphine Deutscher, MD, FACS General, Bariatric, & Minimally Invasive Surgery [facility] Surgery, [place]

## 2019-10-17 NOTE — Brief Op Note (Signed)
10/17/2019  4:45 PM  PATIENT:  Lisa Bates  29 y.o. female  PRE-OPERATIVE DIAGNOSIS: Status post laparoscopic repair of type III hiatal hernia with gastropexy Oct 15, 2019 w/ recurrent hiatal hernia  POST-OPERATIVE DIAGNOSIS:  same  PROCEDURE:  Procedure(s): LAPAROSCOPIC REPAIR OF RECURRENT HIATAL HERNIA WITH POSTERIOR  FUNDOPLICATION, GAASTROPEXY AND  EGD (N/A)  SURGEON:  Surgeon(s) and Role:    Gaynelle Adu, MD - Primary    * Luretha Murphy, MD - Assisting  PHYSICIAN ASSISTANT:   ASSISTANTS: Luretha Murphy MD FACS   ANESTHESIA:   general  EBL:  60 mL   BLOOD ADMINISTERED:none  DRAINS: none   LOCAL MEDICATIONS USED:  NONE  SPECIMEN:  Source of Specimen:  hernia sac  DISPOSITION OF SPECIMEN:  discarded  COUNTS:  YES  TOURNIQUET:  * No tourniquets in log *  DICTATION: .Other Dictation: Dictation Number [id number]  PLAN OF CARE: Admit to inpatient   PATIENT DISPOSITION:  PACU - hemodynamically stable.   Delay start of Pharmacological VTE agent (>24hrs) due to surgical blood loss or risk of bleeding: no

## 2019-10-17 NOTE — Anesthesia Procedure Notes (Signed)
Procedure Name: Intubation Date/Time: 10/17/2019 12:53 PM Performed by: Montel Clock, CRNA Pre-anesthesia Checklist: Patient identified, Emergency Drugs available, Suction available, Patient being monitored and Timeout performed Patient Re-evaluated:Patient Re-evaluated prior to induction Oxygen Delivery Method: Circle system utilized Preoxygenation: Pre-oxygenation with 100% oxygen Induction Type: IV induction and Rapid sequence Laryngoscope Size: Mac and 3 Grade View: Grade II Tube type: Oral Tube size: 7.0 mm Number of attempts: 1 Airway Equipment and Method: Stylet Placement Confirmation: ETT inserted through vocal cords under direct vision,  positive ETCO2 and breath sounds checked- equal and bilateral Secured at: 21 cm Tube secured with: Tape Dental Injury: Teeth and Oropharynx as per pre-operative assessment
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
## 2019-10-17 NOTE — Anesthesia Postprocedure Evaluation (Signed)
Anesthesia Post Note  Patient: Lisa Bates  Procedure(s) Performed: LAPAROSCOPIC REPAIR OF RECURRENT HIATAL HERNIA WITH POSTERIOR  FUNDOPLICATION, GAASTROPEXY AND  E (N/A )     Patient location during evaluation: PACU Anesthesia Type: General Level of consciousness: sedated Pain management: pain level controlled Vital Signs Assessment: post-procedure vital signs reviewed and stable Respiratory status: spontaneous breathing Cardiovascular status: stable Postop Assessment: no apparent nausea or vomiting Anesthetic complications: no    Last Vitals:  Vitals:   10/17/19 1615 10/17/19 1630  BP: 122/82 114/80  Pulse: (!) 104 95  Resp: 18 12  Temp:    SpO2: 96% 95%    Last Pain:  Vitals:   10/17/19 1615  TempSrc:   PainSc: 5    Pain Goal: Patients Stated Pain Goal: 4 (10/17/19 1137)                 Caren Macadam
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
## 2019-10-17 NOTE — Discharge Instructions (Signed)
EATING AFTER YOUR ESOPHAGEAL SURGERY (Stomach Fundoplication, Hiatal Hernia repair, Achalasia surgery, etc)  ######################################################################  EAT Start with a pureed / full liquid diet (see below) Gradually transition to a high fiber diet with a fiber supplement over the next month after discharge.    WALK Walk an hour a day.  Control your pain to do that.    CONTROL PAIN Control pain so that you can walk, sleep, tolerate sneezing/coughing, go up/down stairs.  HAVE A BOWEL MOVEMENT DAILY Keep your bowels regular to avoid problems.  OK to try a laxative to override constipation.  OK to use an antidairrheal to slow down diarrhea.  Call if not better after 2 tries  CALL IF YOU HAVE PROBLEMS/CONCERNS Call if you are still struggling despite following these instructions. Call if you have concerns not answered by these instructions  ######################################################################   After your esophageal surgery, expect some sticking with swallowing over the next 1-2 months.    If food sticks when you eat, it is called "dysphagia".  This is due to swelling around your esophagus at the wrap & hiatal diaphragm repair.  It will gradually ease off over the next few months.  To help you through this temporary phase, we start you out on a pureed (blenderized) diet.  Your first meal in the hospital was thin liquids.  You should have been given a pureed diet by the time you left the hospital.  We ask patients to stay on a pureed diet for the first 2-3 weeks to avoid anything getting "stuck" near your recent surgery.  Don't be alarmed if your ability to swallow doesn't progress according to this plan.  Everyone is different and some diets can advance more or less quickly.    It is often helpful to crush your medications or split them as they can sometimes stick, especially the first week or so.   Some BASIC RULES to follow  are:  Maintain an upright position whenever eating or drinking.  Take small bites - just a teaspoon size bite at a time.  Eat slowly.  It may also help to eat only one food at a time.  Consider nibbling through smaller, more frequent meals & avoid the urge to eat BIG meals  Do not push through feelings of fullness, nausea, or bloatedness  Do not mix solid foods and liquids in the same mouthful  Try not to "wash foods down" with large gulps of liquids.  Avoid carbonated (bubbly/fizzy) drinks.    Avoid foods that make you feel gassy or bloated.  Start with bland foods first.  Wait on trying greasy, fried, or spicy meals until you are tolerating more bland solids well.  Understand that it will be hard to burp and belch at first.  This gradually improves with time.  Expect to be more gassy/flatulent/bloated initially.  Walking will help your body manage it better.  Consider using medications for bloating that contain simethicone such as  Maalox or Gas-X   Consider crushing her medications, especially smaller pills.  The ability to swallow pills should get easier after a few weeks  Eat in a relaxed atmosphere & minimize distractions.  Avoid talking while eating.    Do not use straws.  Following each meal, sit in an upright position (90 degree angle) for 60 to 90 minutes.  Going for a short walk can help as well  If food does stick, don't panic.  Try to relax and let the food pass on its own.    Sipping WARM LIQUID such as strong hot black tea can also help slide it down.   Be gradual in changes & use common sense:  -If you easily tolerating a certain "level" of foods, advance to the next level gradually -If you are having trouble swallowing a particular food, then avoid it.   -If food is sticking when you advance your diet, go back to thinner previous diet (the lower LEVEL) for 1-2 days.  LEVEL 1 = PUREED DIET  Do for the first 2 WEEKS AFTER SURGERY  -Foods in this group are  pureed or blenderized to a smooth, mashed potato-like consistency.  -If necessary, the pureed foods can keep their shape with the addition of a thickening agent.   -Meat should be pureed to a smooth, pasty consistency.  Hot broth or gravy may be added to the pureed meat, approximately 1 oz. of liquid per 3 oz. serving of meat. -CAUTION:  If any foods do not puree into a smooth consistency, swallowing will be more difficult.  (For example, nuts or seeds sometimes do not blend well.)  Hot Foods Cold Foods  Pureed scrambled eggs and cheese Pureed cottage cheese  Baby cereals Thickened juices and nectars  Thinned cooked cereals (no lumps) Thickened milk or eggnog  Pureed French toast or pancakes Ensure  Mashed potatoes Ice cream  Pureed parsley, au gratin, scalloped potatoes, candied sweet potatoes Fruit or Italian ice, sherbet  Pureed buttered or alfredo noodles Plain yogurt  Pureed vegetables (no corn or peas) Instant breakfast  Pureed soups and creamed soups Smooth pudding, mousse, custard  Pureed scalloped apples Whipped gelatin  Gravies Sugar, syrup, honey, jelly  Sauces, cheese, tomato, barbecue, white, creamed Cream  Any baby food Creamer  Alcohol in moderation (not beer or champagne) Margarine  Coffee or tea Mayonnaise   Ketchup, mustard   Apple sauce   SAMPLE MENU:  PUREED DIET Breakfast Lunch Dinner   Orange juice, 1/2 cup  Cream of wheat, 1/2 cup  Pineapple juice, 1/2 cup  Pureed turkey, barley soup, 3/4 cup  Pureed Hawaiian chicken, 3 oz   Scrambled eggs, mashed or blended with cheese, 1/2 cup  Tea or coffee, 1 cup   Whole milk, 1 cup   Non-dairy creamer, 2 Tbsp.  Mashed potatoes, 1/2 cup  Pureed cooled broccoli, 1/2 cup  Apple sauce, 1/2 cup  Coffee or tea  Mashed potatoes, 1/2 cup  Pureed spinach, 1/2 cup  Frozen yogurt, 1/2 cup  Tea or coffee      LEVEL 2 = SOFT DIET  After your first 2 weeks, you can advance to a soft diet.   Keep on this  diet until everything goes down easily.  Hot Foods Cold Foods  White fish Cottage cheese  Stuffed fish Junior baby fruit  Baby food meals Semi thickened juices  Minced soft cooked, scrambled, poached eggs nectars  Souffle & omelets Ripe mashed bananas  Cooked cereals Canned fruit, pineapple sauce, milk  potatoes Milkshake  Buttered or Alfredo noodles Custard  Cooked cooled vegetable Puddings, including tapioca  Sherbet Yogurt  Vegetable soup or alphabet soup Fruit ice, Italian ice  Gravies Whipped gelatin  Sugar, syrup, honey, jelly Junior baby desserts  Sauces:  Cheese, creamed, barbecue, tomato, white Cream  Coffee or tea Margarine   SAMPLE MENU:  LEVEL 2 Breakfast Lunch Dinner   Orange juice, 1/2 cup  Oatmeal, 1/2 cup  Scrambled eggs with cheese, 1/2 cup  Decaffeinated tea, 1 cup  Whole milk, 1 cup    Non-dairy creamer, 2 Tbsp  Pineapple juice, 1/2 cup  Minced beef, 3 oz  Gravy, 2 Tbsp  Mashed potatoes, 1/2 cup  Minced fresh broccoli, 1/2 cup  Applesauce, 1/2 cup  Coffee, 1 cup  Turkey, barley soup, 3/4 cup  Minced Hawaiian chicken, 3 oz  Mashed potatoes, 1/2 cup  Cooked spinach, 1/2 cup  Frozen yogurt, 1/2 cup  Non-dairy creamer, 2 Tbsp      LEVEL 3 = CHOPPED DIET  -After all the foods in level 2 (soft diet) are passing through well you should advance up to more chopped foods.  -It is still important to cut these foods into small pieces and eat slowly.  Hot Foods Cold Foods  Poultry Cottage cheese  Chopped Swedish meatballs Yogurt  Meat salads (ground or flaked meat) Milk  Flaked fish (tuna) Milkshakes  Poached or scrambled eggs Soft, cold, dry cereal  Souffles and omelets Fruit juices or nectars  Cooked cereals Chopped canned fruit  Chopped French toast or pancakes Canned fruit cocktail  Noodles or pasta (no rice) Pudding, mousse, custard  Cooked vegetables (no frozen peas, corn, or mixed vegetables) Green salad  Canned small sweet peas  Ice cream  Creamed soup or vegetable soup Fruit ice, Italian ice  Pureed vegetable soup or alphabet soup Non-dairy creamer  Ground scalloped apples Margarine  Gravies Mayonnaise  Sauces:  Cheese, creamed, barbecue, tomato, white Ketchup  Coffee or tea Mustard   SAMPLE MENU:  LEVEL 3 Breakfast Lunch Dinner   Orange juice, 1/2 cup  Oatmeal, 1/2 cup  Scrambled eggs with cheese, 1/2 cup  Decaffeinated tea, 1 cup  Whole milk, 1 cup  Non-dairy creamer, 2 Tbsp  Ketchup, 1 Tbsp  Margarine, 1 tsp  Salt, 1/4 tsp  Sugar, 2 tsp  Pineapple juice, 1/2 cup  Ground beef, 3 oz  Gravy, 2 Tbsp  Mashed potatoes, 1/2 cup  Cooked spinach, 1/2 cup  Applesauce, 1/2 cup  Decaffeinated coffee  Whole milk  Non-dairy creamer, 2 Tbsp  Margarine, 1 tsp  Salt, 1/4 tsp  Pureed turkey, barley soup, 3/4 cup  Barbecue chicken, 3 oz  Mashed potatoes, 1/2 cup  Ground fresh broccoli, 1/2 cup  Frozen yogurt, 1/2 cup  Decaffeinated tea, 1 cup  Non-dairy creamer, 2 Tbsp  Margarine, 1 tsp  Salt, 1/4 tsp  Sugar, 1 tsp    LEVEL 4:  REGULAR FOODS  -Foods in this group are soft, moist, regularly textured foods.   -This level includes meat and breads, which tend to be the hardest things to swallow.   -Eat very slowly, chew well and continue to avoid carbonated drinks. -most people are at this level in 4-6 weeks  Hot Foods Cold Foods  Baked fish or skinned Soft cheeses - cottage cheese  Souffles and omelets Cream cheese  Eggs Yogurt  Stuffed shells Milk  Spaghetti with meat sauce Milkshakes  Cooked cereal Cold dry cereals (no nuts, dried fruit, coconut)  French toast or pancakes Crackers  Buttered toast Fruit juices or nectars  Noodles or pasta (no rice) Canned fruit  Potatoes (all types) Ripe bananas  Soft, cooked vegetables (no corn, lima, or baked beans) Peeled, ripe, fresh fruit  Creamed soups or vegetable soup Cakes (no nuts, dried fruit, coconut)  Canned chicken  noodle soup Plain doughnuts  Gravies Ice cream  Bacon dressing Pudding, mousse, custard  Sauces:  Cheese, creamed, barbecue, tomato, white Fruit ice, Italian ice, sherbet  Decaffeinated tea or coffee Whipped gelatin  Pork chops Regular gelatin     Canned fruited gelatin molds   Sugar, syrup, honey, jam, jelly   Cream   Non-dairy   Margarine   Oil   Mayonnaise   Ketchup   Mustard   TROUBLESHOOTING IRREGULAR BOWELS  1) Avoid extremes of bowel movements (no bad constipation/diarrhea)  2) Miralax 17gm mixed in 8oz. water or juice-daily. May use BID as needed.  3) Gas-x,Phazyme, etc. as needed for gas & bloating.  4) Soft,bland diet. No spicy,greasy,fried foods.  5) Prilosec over-the-counter as needed  6) May hold gluten/wheat products from diet to see if symptoms improve.  7) May try probiotics (Align, Activa, etc) to help calm the bowels down  7) If symptoms become worse call back immediately.    If you have any questions please call our office at [facility]: [phone].  .........   Managing Your Pain After Surgery Without Opioids    Thank you for participating in our program to help patients manage their pain after surgery without opioids. This is part of our effort to provide you with the best care possible, without exposing you or your family to the risk that opioids pose.  What pain can I expect after surgery? You can expect to have some pain after surgery. This is normal. The pain is typically worse the day after surgery, and quickly begins to get better. Many studies have found that many patients are able to manage their pain after surgery with Over-the-Counter (OTC) medications such as Tylenol and Motrin. If you have a condition that does not allow you to take Tylenol or Motrin, notify your surgical team.  How will I manage my pain? The best strategy for controlling your pain after surgery is around the clock pain control with Tylenol (acetaminophen) and  Motrin (ibuprofen or Advil). Alternating these medications with each other allows you to maximize your pain control. In addition to Tylenol and Motrin, you can use heating pads or ice packs on your incisions to help reduce your pain.  How will I alternate your regular strength over-the-counter pain medication? You will take a dose of pain medication every three hours. ; Start by taking 650 mg of Tylenol (2 pills of 325 mg) ; 3 hours later take 600 mg of Motrin (3 pills of 200 mg) ; 3 hours after taking the Motrin take 650 mg of Tylenol ; 3 hours after that take 600 mg of Motrin.   - 1 -  See example - if your first dose of Tylenol is at 12:00 PM   12:00 PM Tylenol 650 mg (2 pills of 325 mg)  3:00 PM Motrin 600 mg (3 pills of 200 mg)  6:00 PM Tylenol 650 mg (2 pills of 325 mg)  9:00 PM Motrin 600 mg (3 pills of 200 mg)  Continue alternating every 3 hours   We recommend that you follow this schedule around-the-clock for at least 3 days after surgery, or until you feel that it is no longer needed. Use the table on the last page of this handout to keep track of the medications you are taking. Important: Do not take more than 3000mg of Tylenol or 1800mg of Motrin in a 24-hour period. Do not take ibuprofen/Motrin if you have a history of bleeding stomach ulcers, severe kidney disease, &/or actively taking a blood thinner  What if I still have pain? If you have pain that is not controlled with the over-the-counter pain medications (Tylenol and Motrin or Advil) you might have what we call "breakthrough" pain. You will   receive a prescription for a small amount of an opioid pain medication such as Oxycodone, Tramadol, or Tylenol with Codeine. Use these opioid pills in the first 24 hours after surgery if you have breakthrough pain. Do not take more than 1 pill every 4-6 hours.  If you still have uncontrolled pain after using all opioid pills, don't hesitate to call our staff using the number  provided. We will help make sure you are managing your pain in the best way possible, and if necessary, we can provide a prescription for additional pain medication.   Day 1    Time  Name of Medication Number of pills taken  Amount of Acetaminophen  Pain Level   Comments  AM PM       AM PM       AM PM       AM PM       AM PM       AM PM       AM PM       AM PM       Total Daily amount of Acetaminophen Do not take more than  3,000 mg per day      Day 2    Time  Name of Medication Number of pills taken  Amount of Acetaminophen  Pain Level   Comments  AM PM       AM PM       AM PM       AM PM       AM PM       AM PM       AM PM       AM PM       Total Daily amount of Acetaminophen Do not take more than  3,000 mg per day      Day 3    Time  Name of Medication Number of pills taken  Amount of Acetaminophen  Pain Level   Comments  AM PM       AM PM       AM PM       AM PM          AM PM       AM PM       AM PM       AM PM       Total Daily amount of Acetaminophen Do not take more than  3,000 mg per day      Day 4    Time  Name of Medication Number of pills taken  Amount of Acetaminophen  Pain Level   Comments  AM PM       AM PM       AM PM       AM PM       AM PM       AM PM       AM PM       AM PM       Total Daily amount of Acetaminophen Do not take more than  3,000 mg per day      Day 5    Time  Name of Medication Number of pills taken  Amount of Acetaminophen  Pain Level   Comments  AM PM       AM PM       AM PM       AM PM       AM PM         AM PM       AM PM       AM PM       Total Daily amount of Acetaminophen Do not take more than  3,000 mg per day       Day 6    Time  Name of Medication Number of pills taken  Amount of Acetaminophen  Pain Level  Comments  AM PM       AM PM       AM PM       AM PM       AM PM       AM PM       AM PM       AM PM       Total Daily amount of Acetaminophen Do  not take more than  3,000 mg per day      Day 7    Time  Name of Medication Number of pills taken  Amount of Acetaminophen  Pain Level   Comments  AM PM       AM PM       AM PM       AM PM       AM PM       AM PM       AM PM       AM PM       Total Daily amount of Acetaminophen Do not take more than  3,000 mg per day        For additional information about how and where to safely dispose of unused opioid medications - https://www.morepowerfulnc.org  Disclaimer: This document contains information and/or instructional materials adapted from [facility] for the typical patient with your condition. It does not replace medical advice from your health care provider because your experience may differ from that of the typical patient. Talk to your health care provider if you have any questions about this document, your condition or your treatment plan. Adapted from [facility]   

## 2019-10-17 NOTE — Progress Notes (Signed)
2 Days Post-Op   Subjective/Chief Complaint: Liquids felt like that got hung up last night Not much nausea Min pain Slept ok   Objective: Vital signs in last 24 hours: Temp:  [98 F (36.7 C)-98.9 F (37.2 C)] 98 F (36.7 C) (05/06 2440) Pulse Rate:  [68-72] 72 (05/06 0608) Resp:  [17-18] 18 (05/06 0608) BP: (108-120)/(82-86) 111/86 (05/06 1027) SpO2:  [95 %-100 %] 95 % (05/06 2536) Last BM Date: 10/14/19  Intake/Output from previous day: 05/05 0701 - 05/06 0700 In: 3746.9 [P.O.:2040; I.V.:1706.9] Out: 1750 [Urine:1750] Intake/Output this shift: No intake/output data recorded.  Resting comfortably, nontoxic, nad Soft, min TTP, incisions ok. nd cta  Lab Results:  Recent Labs    10/16/19 0424 10/17/19 0435  WBC 12.1* 8.4  HGB 10.9* 10.5*  HCT 35.5* 35.4*  PLT 219 210   BMET Recent Labs    10/16/19 0424 10/17/19 0435  NA 139 139  K 4.1 4.3  CL 105 105  CO2 26 28  GLUCOSE 121* 87  BUN 7 5*  CREATININE 0.73 0.82  CALCIUM 8.1* 8.1*   PT/INR No results for input(s): LABPROT, INR in the last 72 hours. ABG No results for input(s): PHART, HCO3 in the last 72 hours.  Invalid input(s): PCO2, PO2  Studies/Results: CT ABDOMEN W CONTRAST  Result Date: 10/16/2019 CLINICAL DATA:  Status post diaphragmatic hernia repair. EXAM: CT ABDOMEN WITH CONTRAST TECHNIQUE: Multidetector CT imaging of the abdomen was performed using the standard protocol following bolus administration of intravenous contrast. CONTRAST:  126mL OMNIPAQUE IOHEXOL 300 MG/ML  SOLN COMPARISON:  Upper GI series 10/16/2019.  CT scan 08/15/2019. FINDINGS: Lower chest: Approximately 25-50% of the proximal stomach is positioned above the posterior left hemidiaphragm, well demonstrated on sagittal image 77 of series 5 where the mid stomach can be seen coursing through the diaphragmatic hiatus into the abdomen. Proximal stomach above the hemidiaphragm is distended with gas, fluid, and a tiny amount of oral  contrast material. Distal stomach is decompressed. Bibasilar atelectasis, left greater than right with tiny left pleural effusion. Tiny gas bubbles adjacent to the intrathoracic stomach likely related to recent surgery. Hepatobiliary: No suspicious focal abnormality within the liver parenchyma. There is no evidence for gallstones, gallbladder wall thickening, or pericholecystic fluid. No intrahepatic or extrahepatic biliary dilation. Pancreas: No focal mass lesion. No dilatation of the main duct. No intraparenchymal cyst. No peripancreatic edema. Spleen: No splenomegaly. No focal mass lesion. Adrenals/Urinary Tract: No adrenal nodule or mass. Kidneys unremarkable. Stomach/Bowel: Proximal stomach positioned above the left hemidiaphragm, as described above. No small bowel or colonic dilatation in the visualized abdomen. Vascular/Lymphatic: No abdominal aortic aneurysm. No abdominal aortic atherosclerotic calcification. There is no gastrohepatic or hepatoduodenal ligament lymphadenopathy. No retroperitoneal or mesenteric lymphadenopathy. Other: No intraperitoneal free fluid. Musculoskeletal: Gas is identified in the left rectus sheath and deep subcutaneous soft tissues compatible with recent surgery.No worrisome lytic or sclerotic osseous abnormality. IMPRESSION: 1. Approximately 25-50% of the proximal stomach remains above the left hemidiaphragm. 2. Bibasilar atelectasis, left greater than right with tiny left pleural effusion. 3. Soft tissue gas around the proximal stomach and in the left anterior abdominal wall consistent with recent surgery. Electronically Signed   By: Misty Stanley M.D.   On: 10/16/2019 13:09   DG UGI W SINGLE CM (SOL OR THIN BA)  Result Date: 10/16/2019 CLINICAL DATA:  29 year old female status post laparoscopic repair of traumatic hiatal hernia 10/15/2019, presenting for routine postoperative evaluation. EXAM: WATER SOLUBLE UPPER GI SERIES TECHNIQUE: Single-column upper  GI series was  performed using water soluble contrast. CONTRAST:  Water soluble iodinated contrast. COMPARISON:  08/15/2019 CT abdomen/pelvis. FLUOROSCOPY TIME:  Fluoroscopy Time:  1 minutes 48 seconds Radiation Exposure Index (if provided by the fluoroscopic device): 45 mGy Number of Acquired Spot Images: 6 FINDINGS: Scout radiograph demonstrates no evidence of pneumatosis or pneumoperitoneum. Moderate gaseous distention of the colon with no disproportionately dilated small bowel loops. There is a persistent moderate gastric hernia, with the entire mildly distended gastric fundus and a portion of the proximal body of the stomach located above the medial left hemidiaphragm. The mid to distal body of the stomach is intra-abdominal in location and decompressed. The stomach emptied only with the patient in the right lateral decubitus position (no gastric emptying observed with the patient in the supine position). No evidence of gastric volvulus. No evidence of gastric fold thickening. No discrete intraluminal filling defects. Limited views of the esophagus and duodenum are unremarkable. IMPRESSION: Persistent moderate gastric hernia, with the mildly distended gastric fundus and a portion of the proximal body of the stomach located above the medial left hemidiaphragm. The mid to distal body of the stomach is intra-abdominal in location and decompressed. The stomach emptied only with the patient in the right lateral decubitus position. No evidence of gastric volvulus. Electronically Signed   By: Delbert Phenix M.D.   On: 10/16/2019 09:29    Anti-infectives: Anti-infectives (From admission, onward)   Start     Dose/Rate Route Frequency Ordered Stop   10/17/19 1100  ceFAZolin (ANCEF) IVPB 2g/100 mL premix     2 g 200 mL/hr over 30 Minutes Intravenous On call 10/16/19 1522 10/18/19 1100   10/15/19 0600  ceFAZolin (ANCEF) IVPB 2g/100 mL premix     2 g 200 mL/hr over 30 Minutes Intravenous On call to O.R. 10/15/19 7893 10/15/19  0738      Assessment/Plan: s/p Procedure(s): LAPAROSCOPIC REPAIR HIATAL HERNIA WITH GASTROPEXY (N/A)  Please see my note from yesterday.  It appears that a portion of her fundus that slid above her diaphragm. I do not think her crura plasty has failed  Plan is to go to the operating room today for diagnostic laparoscopy, additional crura plasty and/or repair, partial fundoplication, upper endoscopy and potential additional gastropexy  IV antibiotic on-call Risk and benefits of going back into the abdomen extensively discussed with patient and her mother yesterday.  All questions asked and answered  [name]. Andrey Campanile, MD, FACS General, Bariatric, & Minimally Invasive Surgery [facility], [place]    LOS: 0 days    Gaynelle Adu 10/17/2019

## 2019-10-17 NOTE — Transfer of Care (Signed)
Immediate Anesthesia Transfer of Care Note  Patient: CHAMPAYNE KOCIAN  Procedure(s) Performed: LAPAROSCOPIC REPAIR OF RECURRENT HIATAL HERNIA WITH POSTERIOR  FUNDOPLICATION, GAASTROPEXY AND  E (N/A )  Patient Location: PACU  Anesthesia Type:General  Level of Consciousness: awake, oriented, patient cooperative and responds to stimulation  Airway & Oxygen Therapy: Patient Spontanous Breathing and Patient connected to face mask oxygen  Post-op Assessment: Report given to RN and Post -op Vital signs reviewed and stable  Post vital signs: Reviewed and stable  Last Vitals:  Vitals Value Taken Time  BP 122/82 10/17/19 1615  Temp    Pulse 104 10/17/19 1615  Resp 15 10/17/19 1616  SpO2 96 % 10/17/19 1615  Vitals shown include unvalidated device data.  Last Pain:  Vitals:   10/17/19 1137  TempSrc:   PainSc: 0-No pain      Patients Stated Pain Goal: 4 (10/17/19 1137)  Complications: No apparent anesthesia complications

## 2019-10-18 ENCOUNTER — Inpatient Hospital Stay (HOSPITAL_COMMUNITY): Payer: BC Managed Care – PPO

## 2019-10-18 LAB — CBC
HCT: 35 % — ABNORMAL LOW (ref 36.0–46.0)
Hemoglobin: 10.5 g/dL — ABNORMAL LOW (ref 12.0–15.0)
MCH: 23.9 pg — ABNORMAL LOW (ref 26.0–34.0)
MCHC: 30 g/dL (ref 30.0–36.0)
MCV: 79.5 fL — ABNORMAL LOW (ref 80.0–100.0)
Platelets: 226 10*3/uL (ref 150–400)
RBC: 4.4 MIL/uL (ref 3.87–5.11)
RDW: 15.9 % — ABNORMAL HIGH (ref 11.5–15.5)
WBC: 10 10*3/uL (ref 4.0–10.5)
nRBC: 0 % (ref 0.0–0.2)

## 2019-10-18 MED ORDER — OXYCODONE HCL 5 MG PO TABS: 5.0000 mg | ORAL_TABLET | Freq: Four times a day (QID) | ORAL | 0 refills | Status: DC | PRN

## 2019-10-18 MED ORDER — ONDANSETRON 4 MG PO TBDP: 4.0000 mg | ORAL_TABLET | Freq: Four times a day (QID) | ORAL | 0 refills | Status: AC | PRN

## 2019-10-18 MED ORDER — ACETAMINOPHEN 500 MG PO TABS
1000.0000 mg | ORAL_TABLET | Freq: Three times a day (TID) | ORAL | 0 refills | Status: AC
Start: 2019-10-18 — End: 2019-10-23

## 2019-10-18 MED ORDER — PANTOPRAZOLE SODIUM 40 MG PO TBEC: 40.0000 mg | DELAYED_RELEASE_TABLET | Freq: Every day | ORAL | 0 refills | Status: AC

## 2019-10-18 MED ORDER — IOHEXOL 300 MG/ML  SOLN
150.0000 mL | Freq: Once | INTRAMUSCULAR | Status: AC | PRN
  Administered 2019-10-18: 70 mL via ORAL

## 2019-10-18 NOTE — Progress Notes (Signed)
Discharge instructions given to pt and all questions were answered.  

## 2019-10-18 NOTE — Progress Notes (Signed)
1 Day Post-Op   Subjective/Chief Complaint: Liquids going a lot better Just mainly sore No vomiting No real nausea   Objective: Vital signs in last 24 hours: Temp:  [97.7 F (36.5 C)-98.8 F (37.1 C)] 98.2 F (36.8 C) (05/07 0644) Pulse Rate:  [67-104] 67 (05/07 0644) Resp:  [12-28] 18 (05/07 0644) BP: (110-123)/(68-86) 115/76 (05/07 0644) SpO2:  [83 %-98 %] 97 % (05/07 0644) Weight:  [85.4 kg] 85.4 kg (05/06 1137) Last BM Date: 10/14/19  Intake/Output from previous day: 05/06 0701 - 05/07 0700 In: 3679.8 [P.O.:720; I.V.:2859.8; IV Piggyback:100] Out: 2560 [Urine:2500; Blood:60] Intake/Output this shift: No intake/output data recorded.  Alert, resting comfortably, looks more comfortable than on pod 1 Reg Soft, min TTP, incisions ok, min TTP  Lab Results:  Recent Labs    10/17/19 0435 10/18/19 0410  WBC 8.4 10.0  HGB 10.5* 10.5*  HCT 35.4* 35.0*  PLT 210 226   BMET Recent Labs    10/16/19 0424 10/17/19 0435  NA 139 139  K 4.1 4.3  CL 105 105  CO2 26 28  GLUCOSE 121* 87  BUN 7 5*  CREATININE 0.73 0.82  CALCIUM 8.1* 8.1*   PT/INR No results for input(s): LABPROT, INR in the last 72 hours. ABG No results for input(s): PHART, HCO3 in the last 72 hours.  Invalid input(s): PCO2, PO2  Studies/Results: CT ABDOMEN W CONTRAST  Result Date: 10/16/2019 CLINICAL DATA:  Status post diaphragmatic hernia repair. EXAM: CT ABDOMEN WITH CONTRAST TECHNIQUE: Multidetector CT imaging of the abdomen was performed using the standard protocol following bolus administration of intravenous contrast. CONTRAST:  168mL OMNIPAQUE IOHEXOL 300 MG/ML  SOLN COMPARISON:  Upper GI series 10/16/2019.  CT scan 08/15/2019. FINDINGS: Lower chest: Approximately 25-50% of the proximal stomach is positioned above the posterior left hemidiaphragm, well demonstrated on sagittal image 77 of series 5 where the mid stomach can be seen coursing through the diaphragmatic hiatus into the abdomen.  Proximal stomach above the hemidiaphragm is distended with gas, fluid, and a tiny amount of oral contrast material. Distal stomach is decompressed. Bibasilar atelectasis, left greater than right with tiny left pleural effusion. Tiny gas bubbles adjacent to the intrathoracic stomach likely related to recent surgery. Hepatobiliary: No suspicious focal abnormality within the liver parenchyma. There is no evidence for gallstones, gallbladder wall thickening, or pericholecystic fluid. No intrahepatic or extrahepatic biliary dilation. Pancreas: No focal mass lesion. No dilatation of the main duct. No intraparenchymal cyst. No peripancreatic edema. Spleen: No splenomegaly. No focal mass lesion. Adrenals/Urinary Tract: No adrenal nodule or mass. Kidneys unremarkable. Stomach/Bowel: Proximal stomach positioned above the left hemidiaphragm, as described above. No small bowel or colonic dilatation in the visualized abdomen. Vascular/Lymphatic: No abdominal aortic aneurysm. No abdominal aortic atherosclerotic calcification. There is no gastrohepatic or hepatoduodenal ligament lymphadenopathy. No retroperitoneal or mesenteric lymphadenopathy. Other: No intraperitoneal free fluid. Musculoskeletal: Gas is identified in the left rectus sheath and deep subcutaneous soft tissues compatible with recent surgery.No worrisome lytic or sclerotic osseous abnormality. IMPRESSION: 1. Approximately 25-50% of the proximal stomach remains above the left hemidiaphragm. 2. Bibasilar atelectasis, left greater than right with tiny left pleural effusion. 3. Soft tissue gas around the proximal stomach and in the left anterior abdominal wall consistent with recent surgery. Electronically Signed   By: Misty Stanley M.D.   On: 10/16/2019 13:09   DG UGI W SINGLE CM (SOL OR THIN BA)  Result Date: 10/16/2019 CLINICAL DATA:  29 year old female status post laparoscopic repair of traumatic hiatal hernia  10/15/2019, presenting for routine postoperative  evaluation. EXAM: WATER SOLUBLE UPPER GI SERIES TECHNIQUE: Single-column upper GI series was performed using water soluble contrast. CONTRAST:  Water soluble iodinated contrast. COMPARISON:  08/15/2019 CT abdomen/pelvis. FLUOROSCOPY TIME:  Fluoroscopy Time:  1 minutes 48 seconds Radiation Exposure Index (if provided by the fluoroscopic device): 45 mGy Number of Acquired Spot Images: 6 FINDINGS: Scout radiograph demonstrates no evidence of pneumatosis or pneumoperitoneum. Moderate gaseous distention of the colon with no disproportionately dilated small bowel loops. There is a persistent moderate gastric hernia, with the entire mildly distended gastric fundus and a portion of the proximal body of the stomach located above the medial left hemidiaphragm. The mid to distal body of the stomach is intra-abdominal in location and decompressed. The stomach emptied only with the patient in the right lateral decubitus position (no gastric emptying observed with the patient in the supine position). No evidence of gastric volvulus. No evidence of gastric fold thickening. No discrete intraluminal filling defects. Limited views of the esophagus and duodenum are unremarkable. IMPRESSION: Persistent moderate gastric hernia, with the mildly distended gastric fundus and a portion of the proximal body of the stomach located above the medial left hemidiaphragm. The mid to distal body of the stomach is intra-abdominal in location and decompressed. The stomach emptied only with the patient in the right lateral decubitus position. No evidence of gastric volvulus. Electronically Signed   By: Delbert Phenix M.D.   On: 10/16/2019 09:29    Anti-infectives: Anti-infectives (From admission, onward)   Start     Dose/Rate Route Frequency Ordered Stop   10/17/19 1100  ceFAZolin (ANCEF) IVPB 2g/100 mL premix     2 g 200 mL/hr over 30 Minutes Intravenous On call 10/16/19 1522 10/17/19 1305   10/15/19 0600  ceFAZolin (ANCEF) IVPB 2g/100 mL  premix     2 g 200 mL/hr over 30 Minutes Intravenous On call to O.R. 10/15/19 6294 10/15/19 0738      Assessment/Plan: s/p Procedure(s): LAPAROSCOPIC REPAIR OF RECURRENT HIATAL HERNIA WITH POSTERIOR  FUNDOPLICATION, GAASTROPEXY AND  E (N/A)  Doing well.  No fever. No tachycardia Will get ugi to confirm anatomy Discussed intraop findings and procedure If ugi ok and tolerates fulls - will dc later today Discussed dc instructions  [name]. Andrey Campanile, MD, FACS General, Bariatric, & Minimally Invasive Surgery [facility], [place]   LOS: 1 day    Gaynelle Adu 10/18/2019
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
## 2020-05-01 ENCOUNTER — Emergency Department (HOSPITAL_BASED_OUTPATIENT_CLINIC_OR_DEPARTMENT_OTHER): Payer: BC Managed Care – PPO

## 2020-05-01 ENCOUNTER — Other Ambulatory Visit: Payer: Self-pay

## 2020-05-01 ENCOUNTER — Emergency Department (HOSPITAL_BASED_OUTPATIENT_CLINIC_OR_DEPARTMENT_OTHER)
Admission: EM | Admit: 2020-05-01 | Discharge: 2020-05-02 | Disposition: A | Discharge status: Home / Self Care | Payer: BC Managed Care – PPO | Attending: Emergency Medicine | Admitting: Emergency Medicine

## 2020-05-01 ENCOUNTER — Encounter (HOSPITAL_BASED_OUTPATIENT_CLINIC_OR_DEPARTMENT_OTHER): Payer: Self-pay | Admitting: Emergency Medicine

## 2020-05-01 DIAGNOSIS — Z23 Encounter for immunization: Secondary | ICD-10-CM | POA: Diagnosis not present

## 2020-05-01 DIAGNOSIS — S6991XA Unspecified injury of right wrist, hand and finger(s), initial encounter: Secondary | ICD-10-CM | POA: Diagnosis present

## 2020-05-01 DIAGNOSIS — S61411A Laceration without foreign body of right hand, initial encounter: Secondary | ICD-10-CM | POA: Diagnosis not present

## 2020-05-01 DIAGNOSIS — W25XXXA Contact with sharp glass, initial encounter: Secondary | ICD-10-CM | POA: Diagnosis not present

## 2020-05-01 MED ORDER — ACETAMINOPHEN 500 MG PO TABS
1000.0000 mg | ORAL_TABLET | Freq: Once | ORAL | Status: AC
  Administered 2020-05-02: 1000 mg via ORAL
  Filled 2020-05-01: qty 2

## 2020-05-01 MED ORDER — LIDOCAINE HCL 2 % IJ SOLN
15.0000 mL | Freq: Once | INTRAMUSCULAR | Status: AC
  Administered 2020-05-02: 300 mg via INTRADERMAL
  Filled 2020-05-01: qty 20

## 2020-05-01 MED ORDER — TETANUS-DIPHTH-ACELL PERTUSSIS 5-2.5-18.5 LF-MCG/0.5 IM SUSY
0.5000 mL | PREFILLED_SYRINGE | Freq: Once | INTRAMUSCULAR | Status: AC
  Administered 2020-05-02: 0.5 mL via INTRAMUSCULAR
  Filled 2020-05-01: qty 0.5

## 2020-05-01 NOTE — ED Notes (Signed)
Damp gauze dressing applied in triage.

## 2020-05-01 NOTE — ED Notes (Signed)
Laceration to rt 5th digit base, laceration occurred while cleaning a glass, states glass "busted in her hand". Triage RN has pt soaking in water. Bleeding is controlled, pt states thumb feels numb and tingling, unable to make "hitchhiking sign" due to pain, pain increases pain.

## 2020-05-01 NOTE — ED Notes (Signed)
Verified allergy status, pt states unknown on Tetanus

## 2020-05-01 NOTE — ED Notes (Signed)
Suture Cart to bedside 

## 2020-05-01 NOTE — ED Triage Notes (Signed)
Pt has laceration to right hand at base of thumb from a glass that broke while she was washing it. No active bleeding noted.

## 2020-05-02 ENCOUNTER — Encounter (HOSPITAL_BASED_OUTPATIENT_CLINIC_OR_DEPARTMENT_OTHER): Payer: Self-pay | Admitting: Emergency Medicine

## 2020-05-02 MED ORDER — CEPHALEXIN 500 MG PO CAPS: ORAL_CAPSULE | ORAL | 0 refills | Status: DC

## 2020-05-02 MED ORDER — CEPHALEXIN 250 MG PO CAPS
ORAL_CAPSULE | ORAL | Status: AC
  Filled 2020-05-02: qty 1

## 2020-05-02 MED ORDER — LIDOCAINE-EPINEPHRINE (PF) 1 %-1:200000 IJ SOLN
INTRAMUSCULAR | Status: AC
  Administered 2020-05-02: 30 mL
  Filled 2020-05-02: qty 30

## 2020-05-02 MED ORDER — CEPHALEXIN 250 MG PO CAPS
500.0000 mg | ORAL_CAPSULE | Freq: Once | ORAL | Status: AC
  Administered 2020-05-02: 500 mg via ORAL
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
## 2020-05-13 ENCOUNTER — Other Ambulatory Visit: Payer: Self-pay | Admitting: Orthopedic Surgery

## 2020-05-13 ENCOUNTER — Encounter (HOSPITAL_BASED_OUTPATIENT_CLINIC_OR_DEPARTMENT_OTHER): Payer: Self-pay | Admitting: Orthopedic Surgery

## 2020-05-13 ENCOUNTER — Other Ambulatory Visit: Payer: Self-pay

## 2020-05-16 ENCOUNTER — Other Ambulatory Visit (HOSPITAL_COMMUNITY)
Admission: RE | Admit: 2020-05-16 | Discharge: 2020-05-16 | Disposition: A | Discharge status: Home / Self Care | Payer: BC Managed Care – PPO | Source: Ambulatory Visit | Attending: Orthopedic Surgery | Admitting: Orthopedic Surgery

## 2020-05-16 DIAGNOSIS — S6431XA Injury of digital nerve of right thumb, initial encounter: Secondary | ICD-10-CM | POA: Diagnosis not present

## 2020-05-16 DIAGNOSIS — S65011A Laceration of ulnar artery at wrist and hand level of right arm, initial encounter: Secondary | ICD-10-CM | POA: Diagnosis not present

## 2020-05-16 DIAGNOSIS — Z01812 Encounter for preprocedural laboratory examination: Secondary | ICD-10-CM | POA: Insufficient documentation

## 2020-05-16 DIAGNOSIS — S61011A Laceration without foreign body of right thumb without damage to nail, initial encounter: Secondary | ICD-10-CM | POA: Diagnosis present

## 2020-05-16 DIAGNOSIS — Z20822 Contact with and (suspected) exposure to covid-19: Secondary | ICD-10-CM | POA: Diagnosis not present

## 2020-05-16 DIAGNOSIS — Z79899 Other long term (current) drug therapy: Secondary | ICD-10-CM | POA: Diagnosis not present

## 2020-05-16 DIAGNOSIS — X58XXXA Exposure to other specified factors, initial encounter: Secondary | ICD-10-CM | POA: Diagnosis not present

## 2020-05-16 LAB — SARS CORONAVIRUS 2 (TAT 6-24 HRS): SARS Coronavirus 2: NEGATIVE

## 2020-05-18 ENCOUNTER — Encounter (HOSPITAL_BASED_OUTPATIENT_CLINIC_OR_DEPARTMENT_OTHER): Payer: Self-pay | Admitting: Orthopedic Surgery

## 2020-05-19 ENCOUNTER — Encounter (HOSPITAL_BASED_OUTPATIENT_CLINIC_OR_DEPARTMENT_OTHER): Payer: Self-pay | Admitting: Orthopedic Surgery

## 2020-05-19 ENCOUNTER — Ambulatory Visit (HOSPITAL_BASED_OUTPATIENT_CLINIC_OR_DEPARTMENT_OTHER): Payer: BC Managed Care – PPO | Admitting: Certified Registered"

## 2020-05-19 ENCOUNTER — Encounter (HOSPITAL_BASED_OUTPATIENT_CLINIC_OR_DEPARTMENT_OTHER)
Admission: RE | Disposition: A | Discharge status: Home / Self Care | Payer: Self-pay | Source: Ambulatory Visit | Attending: Orthopedic Surgery

## 2020-05-19 ENCOUNTER — Other Ambulatory Visit: Payer: Self-pay

## 2020-05-19 ENCOUNTER — Ambulatory Visit (HOSPITAL_BASED_OUTPATIENT_CLINIC_OR_DEPARTMENT_OTHER)
Admission: RE | Admit: 2020-05-19 | Discharge: 2020-05-19 | Disposition: A | Discharge status: Home / Self Care | Payer: BC Managed Care – PPO | Source: Ambulatory Visit | Attending: Orthopedic Surgery | Admitting: Orthopedic Surgery

## 2020-05-19 DIAGNOSIS — S6431XA Injury of digital nerve of right thumb, initial encounter: Secondary | ICD-10-CM | POA: Insufficient documentation

## 2020-05-19 DIAGNOSIS — Z20822 Contact with and (suspected) exposure to covid-19: Secondary | ICD-10-CM | POA: Insufficient documentation

## 2020-05-19 DIAGNOSIS — S61011A Laceration without foreign body of right thumb without damage to nail, initial encounter: Secondary | ICD-10-CM | POA: Insufficient documentation

## 2020-05-19 DIAGNOSIS — Z79899 Other long term (current) drug therapy: Secondary | ICD-10-CM | POA: Insufficient documentation

## 2020-05-19 DIAGNOSIS — S65011A Laceration of ulnar artery at wrist and hand level of right arm, initial encounter: Secondary | ICD-10-CM | POA: Insufficient documentation

## 2020-05-19 DIAGNOSIS — X58XXXA Exposure to other specified factors, initial encounter: Secondary | ICD-10-CM | POA: Insufficient documentation

## 2020-05-19 HISTORY — DX: Gastro-esophageal reflux disease without esophagitis: K21.9

## 2020-05-19 HISTORY — PX: NERVE REPAIR: SHX2083

## 2020-05-19 HISTORY — PX: WOUND EXPLORATION: SHX6188

## 2020-05-19 LAB — POCT PREGNANCY, URINE: Preg Test, Ur: NEGATIVE

## 2020-05-19 SURGERY — WOUND EXPLORATION
Anesthesia: General | Site: Hand | Laterality: Right

## 2020-05-19 MED ORDER — FENTANYL CITRATE (PF) 100 MCG/2ML IJ SOLN
INTRAMUSCULAR | Status: DC | PRN
  Administered 2020-05-19 (×2): 50 ug via INTRAVENOUS

## 2020-05-19 MED ORDER — SULFAMETHOXAZOLE-TRIMETHOPRIM 800-160 MG PO TABS: 1.0000 | ORAL_TABLET | Freq: Two times a day (BID) | ORAL | 0 refills | Status: AC

## 2020-05-19 MED ORDER — ONDANSETRON HCL 4 MG/2ML IJ SOLN: 4.0000 mg | Freq: Once | INTRAMUSCULAR | Status: DC | PRN

## 2020-05-19 MED ORDER — EPHEDRINE 5 MG/ML INJ
INTRAVENOUS | Status: AC
  Filled 2020-05-19: qty 10

## 2020-05-19 MED ORDER — MIDAZOLAM HCL 2 MG/2ML IJ SOLN
INTRAMUSCULAR | Status: AC
  Filled 2020-05-19: qty 2

## 2020-05-19 MED ORDER — OXYCODONE HCL 5 MG/5ML PO SOLN: 5.0000 mg | Freq: Once | ORAL | Status: AC | PRN

## 2020-05-19 MED ORDER — EPHEDRINE SULFATE 50 MG/ML IJ SOLN
INTRAMUSCULAR | Status: DC | PRN
  Administered 2020-05-19: 10 mg via INTRAVENOUS

## 2020-05-19 MED ORDER — DEXAMETHASONE SODIUM PHOSPHATE 10 MG/ML IJ SOLN
INTRAMUSCULAR | Status: DC | PRN
  Administered 2020-05-19: 4 mg via INTRAVENOUS

## 2020-05-19 MED ORDER — CEFAZOLIN SODIUM-DEXTROSE 2-4 GM/100ML-% IV SOLN
2.0000 g | INTRAVENOUS | Status: AC
  Administered 2020-05-19: 2 g via INTRAVENOUS

## 2020-05-19 MED ORDER — CEFAZOLIN SODIUM-DEXTROSE 1-4 GM/50ML-% IV SOLN
INTRAVENOUS | Status: AC
  Filled 2020-05-19: qty 100

## 2020-05-19 MED ORDER — BUPIVACAINE HCL (PF) 0.25 % IJ SOLN
INTRAMUSCULAR | Status: AC
  Filled 2020-05-19: qty 30

## 2020-05-19 MED ORDER — LACTATED RINGERS IV SOLN: INTRAVENOUS | Status: DC

## 2020-05-19 MED ORDER — PROPOFOL 10 MG/ML IV BOLUS
INTRAVENOUS | Status: DC | PRN
  Administered 2020-05-19: 200 mg via INTRAVENOUS

## 2020-05-19 MED ORDER — HYDROCODONE-ACETAMINOPHEN 5-325 MG PO TABS: ORAL_TABLET | ORAL | 0 refills | Status: AC

## 2020-05-19 MED ORDER — AMISULPRIDE (ANTIEMETIC) 5 MG/2ML IV SOLN: 10.0000 mg | Freq: Once | INTRAVENOUS | Status: DC | PRN

## 2020-05-19 MED ORDER — MIDAZOLAM HCL 5 MG/5ML IJ SOLN
INTRAMUSCULAR | Status: DC | PRN
  Administered 2020-05-19: 2 mg via INTRAVENOUS

## 2020-05-19 MED ORDER — OXYCODONE HCL 5 MG PO TABS
ORAL_TABLET | ORAL | Status: AC
  Filled 2020-05-19: qty 1

## 2020-05-19 MED ORDER — LIDOCAINE HCL (CARDIAC) PF 100 MG/5ML IV SOSY
PREFILLED_SYRINGE | INTRAVENOUS | Status: DC | PRN
  Administered 2020-05-19: 60 mg via INTRAVENOUS

## 2020-05-19 MED ORDER — PROPOFOL 500 MG/50ML IV EMUL
INTRAVENOUS | Status: DC | PRN
  Administered 2020-05-19: 25 ug/kg/min via INTRAVENOUS

## 2020-05-19 MED ORDER — FENTANYL CITRATE (PF) 100 MCG/2ML IJ SOLN
INTRAMUSCULAR | Status: AC
  Filled 2020-05-19: qty 2

## 2020-05-19 MED ORDER — ONDANSETRON HCL 4 MG/2ML IJ SOLN
INTRAMUSCULAR | Status: DC | PRN
  Administered 2020-05-19: 4 mg via INTRAVENOUS

## 2020-05-19 MED ORDER — FENTANYL CITRATE (PF) 100 MCG/2ML IJ SOLN
25.0000 ug | INTRAMUSCULAR | Status: DC | PRN
  Administered 2020-05-19: 25 ug via INTRAVENOUS
  Administered 2020-05-19: 50 ug via INTRAVENOUS

## 2020-05-19 MED ORDER — BUPIVACAINE HCL (PF) 0.25 % IJ SOLN
INTRAMUSCULAR | Status: DC | PRN
  Administered 2020-05-19: 6 mL

## 2020-05-19 MED ORDER — OXYCODONE HCL 5 MG PO TABS
5.0000 mg | ORAL_TABLET | Freq: Once | ORAL | Status: AC | PRN
  Administered 2020-05-19: 5 mg via ORAL

## 2020-05-19 SURGICAL SUPPLY — 68 items
APL PRP STRL LF DISP 70% ISPRP (MISCELLANEOUS) ×2
BAG DECANTER FOR FLEXI CONT (MISCELLANEOUS) IMPLANT
BLADE MINI RND TIP GREEN BEAV (BLADE) IMPLANT
BLADE SURG 15 STRL LF DISP TIS (BLADE) ×4 IMPLANT
BLADE SURG 15 STRL SS (BLADE) ×8
BNDG CMPR 9X4 STRL LF SNTH (GAUZE/BANDAGES/DRESSINGS) ×2
BNDG ELASTIC 3X5.8 VLCR STR LF (GAUZE/BANDAGES/DRESSINGS) ×4 IMPLANT
BNDG ESMARK 4X9 LF (GAUZE/BANDAGES/DRESSINGS) ×3 IMPLANT
BNDG GAUZE ELAST 4 BULKY (GAUZE/BANDAGES/DRESSINGS) ×4 IMPLANT
BRUSH SCRUB EZ PLAIN DRY (MISCELLANEOUS) ×4 IMPLANT
CATH ROBINSON RED A/P 10FR (CATHETERS) IMPLANT
CHLORAPREP W/TINT 26 (MISCELLANEOUS) ×4 IMPLANT
CORD BIPOLAR FORCEPS 12FT (ELECTRODE) ×4 IMPLANT
COVER BACK TABLE 60X90IN (DRAPES) ×4 IMPLANT
COVER MAYO STAND STRL (DRAPES) ×4 IMPLANT
COVER WAND RF STERILE (DRAPES) IMPLANT
CUFF TOURN SGL QUICK 18X4 (TOURNIQUET CUFF) ×3 IMPLANT
DECANTER SPIKE VIAL GLASS SM (MISCELLANEOUS) ×4 IMPLANT
DRAPE EXTREMITY T 121X128X90 (DISPOSABLE) ×4 IMPLANT
DRAPE SURG 17X23 STRL (DRAPES) ×4 IMPLANT
GAUZE SPONGE 4X4 12PLY STRL (GAUZE/BANDAGES/DRESSINGS) ×4 IMPLANT
GAUZE XEROFORM 1X8 LF (GAUZE/BANDAGES/DRESSINGS) ×4 IMPLANT
GLOVE BIO SURGEON STRL SZ7.5 (GLOVE) ×4 IMPLANT
GLOVE BIOGEL PI IND STRL 8.5 (GLOVE) IMPLANT
GLOVE BIOGEL PI INDICATOR 8.5 (GLOVE)
GLOVE SRG 8 PF TXTR STRL LF DI (GLOVE) ×2 IMPLANT
GLOVE SURG ORTHO 8.0 STRL STRW (GLOVE) ×3 IMPLANT
GLOVE SURG SS PI 7.5 STRL IVOR (GLOVE) ×3 IMPLANT
GLOVE SURG SYN 7.5  E (GLOVE) ×4
GLOVE SURG SYN 7.5 E (GLOVE) ×2 IMPLANT
GLOVE SURG SYN 7.5 PF PI (GLOVE) IMPLANT
GLOVE SURG UNDER POLY LF SZ8 (GLOVE) ×4
GOWN STRL REUS W/ TWL LRG LVL3 (GOWN DISPOSABLE) ×1 IMPLANT
GOWN STRL REUS W/ TWL XL LVL3 (GOWN DISPOSABLE) ×2 IMPLANT
GOWN STRL REUS W/TWL LRG LVL3 (GOWN DISPOSABLE)
GOWN STRL REUS W/TWL XL LVL3 (GOWN DISPOSABLE) ×15 IMPLANT
LOOP VESSEL MAXI BLUE (MISCELLANEOUS) ×3 IMPLANT
NDL HYPO 25X1 1.5 SAFETY (NEEDLE) IMPLANT
NDL SAFETY ECLIPSE 18X1.5 (NEEDLE) IMPLANT
NEEDLE HYPO 18GX1.5 SHARP (NEEDLE)
NEEDLE HYPO 25X1 1.5 SAFETY (NEEDLE) ×4 IMPLANT
NS IRRIG 1000ML POUR BTL (IV SOLUTION) ×4 IMPLANT
PACK BASIN DAY SURGERY FS (CUSTOM PROCEDURE TRAY) ×4 IMPLANT
PAD CAST 3X4 CTTN HI CHSV (CAST SUPPLIES) ×2 IMPLANT
PAD CAST 4YDX4 CTTN HI CHSV (CAST SUPPLIES) IMPLANT
PADDING CAST ABS 4INX4YD NS (CAST SUPPLIES) ×2
PADDING CAST ABS COTTON 4X4 ST (CAST SUPPLIES) ×2 IMPLANT
PADDING CAST COTTON 3X4 STRL (CAST SUPPLIES) ×4
PADDING CAST COTTON 4X4 STRL (CAST SUPPLIES)
SLEEVE SCD COMPRESS KNEE MED (MISCELLANEOUS) ×3 IMPLANT
SPEAR EYE SURG WECK-CEL (MISCELLANEOUS) ×4 IMPLANT
SPLINT PLASTER CAST XFAST 3X15 (CAST SUPPLIES) IMPLANT
SPLINT PLASTER XTRA FASTSET 3X (CAST SUPPLIES)
STOCKINETTE 4X48 STRL (DRAPES) ×4 IMPLANT
SUT ETHIBOND 3-0 V-5 (SUTURE) IMPLANT
SUT ETHILON 4 0 PS 2 18 (SUTURE) ×4 IMPLANT
SUT FIBERWIRE 4-0 18 TAPR NDL (SUTURE)
SUT NYLON 9 0 VRM6 (SUTURE) ×3 IMPLANT
SUT PROLENE 6 0 P 1 18 (SUTURE) IMPLANT
SUT SILK 4 0 PS 2 (SUTURE) IMPLANT
SUT SUPRAMID 4-0 (SUTURE) IMPLANT
SUT VICRYL 4-0 PS2 18IN ABS (SUTURE) IMPLANT
SUTURE FIBERWR 4-0 18 TAPR NDL (SUTURE) IMPLANT
SYR BULB EAR ULCER 3OZ GRN STR (SYRINGE) ×4 IMPLANT
SYR CONTROL 10ML LL (SYRINGE) IMPLANT
TOWEL GREEN STERILE FF (TOWEL DISPOSABLE) ×8 IMPLANT
TRAY DSU PREP LF (CUSTOM PROCEDURE TRAY) ×3 IMPLANT
UNDERPAD 30X36 HEAVY ABSORB (UNDERPADS AND DIAPERS) ×4 IMPLANT

## 2020-05-19 NOTE — Anesthesia Postprocedure Evaluation (Signed)
Anesthesia Post Note  Patient: SEAIRRA OTANI  Procedure(s) Performed: RIGHT THUMB WOUND EXPLORATION (Right Hand) RIGHT THUMB NERVE REPAIR (Right Hand)     Patient location during evaluation: PACU Anesthesia Type: General Level of consciousness: sedated Pain management: pain level controlled Vital Signs Assessment: post-procedure vital signs reviewed and stable Respiratory status: spontaneous breathing and respiratory function stable Cardiovascular status: stable Postop Assessment: no apparent nausea or vomiting Anesthetic complications: no   No complications documented.  Last Vitals:  Vitals:   05/19/20 1508 05/19/20 1520  BP: 122/65 121/67  Pulse: (!) 115 (!) 106  Resp: 17 16  Temp:  (!) 36.4 C  SpO2: 96% 100%    Last Pain:  Vitals:   05/19/20 1508  TempSrc:   PainSc: 3                  Candra R Maleta Pacha

## 2020-05-19 NOTE — H&P (Signed)
Lisa Bates is an 29 y.o. female.   Chief Complaint: right thumb laceration HPI: 29 yo female states she sustained laceration to right thumb/palm 05/01/20.  Seen at [facility] where wound cleaned and sutured.  She notes numbness on the ulnar side of the thumb.  Allergies: No Known Allergies  Past Medical History:  Diagnosis Date  . Depression    not on meds, doing good  . GERD (gastroesophageal reflux disease)    controlled  . Headache(784.0)    otc med prn    Past Surgical History:  Procedure Laterality Date  . CARPAL TUNNEL RELEASE    . CESAREAN SECTION  07/04/2012   Procedure: CESAREAN SECTION;  Surgeon: Leslie Andrea, MD;  Location: [facility];  Service: Obstetrics;  Laterality: N/A;  Primary Cesarean Section Delivery Baby Girl @ 1840, Apgars 3/9   . CESAREAN SECTION N/A 11/11/2014   Procedure: REPEAT CESAREAN SECTION;  Surgeon: Harold Hedge, MD;  Location: [facility];  Service: Obstetrics;  Laterality: N/A;  . HIATAL HERNIA REPAIR N/A 10/15/2019   Procedure: LAPAROSCOPIC REPAIR HIATAL HERNIA WITH GASTROPEXY;  Surgeon: Gaynelle Adu, MD;  Location: [facility];  Service: General;  Laterality: N/A;  . HIATAL HERNIA REPAIR N/A 10/17/2019   Procedure: LAPAROSCOPIC REPAIR OF RECURRENT HIATAL HERNIA WITH POSTERIOR  FUNDOPLICATION, GAASTROPEXY AND  E;  Surgeon: Gaynelle Adu, MD;  Location: [facility];  Service: General;  Laterality: N/A;  . SHOULDER ARTHROSCOPY Right   . TUBAL LIGATION      Family History: Family History  Problem Relation Age of Onset  . Depression Mother   . Hypertension Maternal Grandmother   . Cancer Maternal Grandmother   . Depression Maternal Grandmother   . Diabetes Paternal Grandmother   . Diabetes Paternal Grandfather   . Other Neg Hx     Social History:   reports that she has never smoked. She has never used smokeless tobacco. She reports current alcohol use. She reports that she does not use drugs.  Medications: Medications Prior to Admission  Medication Sig  Dispense Refill  . ALPRAZolam (XANAX) 0.5 MG tablet Take 0.5 mg by mouth at bedtime.     . citalopram (CELEXA) 40 MG tablet Take 40 mg by mouth at bedtime.     . ondansetron (ZOFRAN-ODT) 4 MG disintegrating tablet Take 1 tablet (4 mg total) by mouth every 6 (six) hours as needed for nausea. 20 tablet 0  . pantoprazole (PROTONIX) 40 MG tablet Take 1 tablet (40 mg total) by mouth daily. 30 tablet 0    No results found for this or any previous visit (from the past 48 hour(s)).  No results found.   A comprehensive review of systems was negative.  Height 5\' 3"  (1.6 m), weight 68 kg, last menstrual period 05/04/2020.  General appearance: alert, cooperative and appears stated age Head: Normocephalic, without obvious abnormality, atraumatic Neck: supple, symmetrical, trachea midline Cardio: regular rate and rhythm Resp: clear to auscultation bilaterally Extremities: Intact sensation and capillary refill all digits except decreased sensation on ulnar side of thumb.  +epl/fpl/io.  No wounds.  Pulses: 2+ and symmetric Skin: Skin color, texture, turgor normal. No rashes or lesions Neurologic: Grossly normal Incision/Wound: wound ulnar base of thumb  Assessment/Plan Right thumb laceration with possible digital nerve laceration.  She wishes to have operative exploration with repair of tendon/artery/nerve as necessary.  Risks, benefits, and alternatives of surgery have been discussed and the patient agrees with the plan of care.   05/06/2020 05/19/2020, 11:34 AM

## 2020-05-19 NOTE — Op Note (Signed)
I assisted Surgeon(s) and Role:    * Betha Loa, MD - Primary    * Cindee Salt, MD - Assisting on the Procedure(s): RIGHT THUMB WOUND EXPLORATION RIGHT THUMB NERVE REPAIR on 05/19/2020.  I provided assistance on this case as follows:seup, debridement, identification of the nerve, bringing the microscope into the field, repair of the nerve using microsurgical techniques, closure of the incision and application of the dressings and splint. Electronically signed by: Cindee Salt, MD Date: 05/19/2020 Time: 2:21 PM

## 2020-05-19 NOTE — Discharge Instructions (Addendum)
[facility] Instructions Hand Surgery  Wound Care: Keep your hand elevated above the level of your heart.  Do not allow it to dangle by your side.  Keep the dressing dry and do not remove it unless your doctor advises you to do so.  He will usually change it at the time of your post-op visit.  Moving your fingers is advised to stimulate circulation but will depend on the site of your surgery.  If you have a splint applied, your doctor will advise you regarding movement.  Activity: Do not drive or operate machinery today.  Rest today and then you may return to your normal activity and work as indicated by your physician.  Diet:  Drink liquids today or eat a light diet.  You may resume a regular diet tomorrow.    General expectations: Pain for two to three days. Fingers may become slightly swollen.  Call your doctor if any of the following occur: Severe pain not relieved by pain medication. Elevated temperature. Dressing soaked with blood. Inability to move fingers. White or bluish color to fingers.   Post Anesthesia Home Care Instructions  Activity: Get plenty of rest for the remainder of the day. A responsible individual must stay with you for 24 hours following the procedure.  For the next 24 hours, DO NOT: -Drive a car -Advertising copywriter -Drink alcoholic beverages -Take any medication unless instructed by your physician -Make any legal decisions or sign important papers.  Meals: Start with liquid foods such as gelatin or soup. Progress to regular foods as tolerated. Avoid greasy, spicy, heavy foods. If nausea and/or vomiting occur, drink only clear liquids until the nausea and/or vomiting subsides. Call your physician if vomiting continues.  Special Instructions/Symptoms: Your throat may feel dry or sore from the anesthesia or the breathing tube placed in your throat during surgery. If this causes discomfort, gargle with warm salt water. The discomfort should disappear within  24 hours.  If you had a scopolamine patch placed behind your ear for the management of post- operative nausea and/or vomiting:  1. The medication in the patch is effective for 72 hours, after which it should be removed.  Wrap patch in a tissue and discard in the trash. Wash hands thoroughly with soap and water. 2. You may remove the patch earlier than 72 hours if you experience unpleasant side effects which may include dry mouth, dizziness or visual disturbances. 3. Avoid touching the patch. Wash your hands with soap and water after contact with the patch.     Post Anesthesia Home Care Instructions  Activity: Get plenty of rest for the remainder of the day. A responsible individual must stay with you for 24 hours following the procedure.  For the next 24 hours, DO NOT: -Drive a car -Advertising copywriter -Drink alcoholic beverages -Take any medication unless instructed by your physician -Make any legal decisions or sign important papers.  Meals: Start with liquid foods such as gelatin or soup. Progress to regular foods as tolerated. Avoid greasy, spicy, heavy foods. If nausea and/or vomiting occur, drink only clear liquids until the nausea and/or vomiting subsides. Call your physician if vomiting continues.  Special Instructions/Symptoms: Your throat may feel dry or sore from the anesthesia or the breathing tube placed in your throat during surgery. If this causes discomfort, gargle with warm salt water. The discomfort should disappear within 24 hours.  If you had a scopolamine patch placed behind your ear for the management of post- operative nausea and/or vomiting:  1. The medication in the patch is effective for 72 hours, after which it should be removed.  Wrap patch in a tissue and discard in the trash. Wash hands thoroughly with soap and water. 2. You may remove the patch earlier than 72 hours if you experience unpleasant side effects which may include dry mouth, dizziness or visual  disturbances. 3. Avoid touching the patch. Wash your hands with soap and water after contact with the patch.    Do not take oxycodone until 9:30 pm.

## 2020-05-19 NOTE — Anesthesia Preprocedure Evaluation (Signed)
Anesthesia Evaluation  Patient identified by MRN, date of birth, ID band Patient awake    Reviewed: Allergy & Precautions, NPO status , Patient's Chart, lab work & pertinent test results  History of Anesthesia Complications Negative for: history of anesthetic complications  Airway Mallampati: I  TM Distance: >3 FB Neck ROM: Full    Dental  (+) Teeth Intact   Pulmonary neg pulmonary ROS,    Pulmonary exam normal        Cardiovascular negative cardio ROS Normal cardiovascular exam     Neuro/Psych negative neurological ROS  negative psych ROS   GI/Hepatic Neg liver ROS, hiatal hernia (s/p fundoplication), GERD  ,  Endo/Other  negative endocrine ROS  Renal/GU negative Renal ROS  negative genitourinary   Musculoskeletal negative musculoskeletal ROS (+)   Abdominal   Peds  Hematology negative hematology ROS (+)   Anesthesia Other Findings   Reproductive/Obstetrics                            Anesthesia Physical Anesthesia Plan  ASA: II  Anesthesia Plan: General   Post-op Pain Management:    Induction: Intravenous  PONV Risk Score and Plan: 3 and Ondansetron, Dexamethasone, Midazolam and Treatment may vary due to age or medical condition  Airway Management Planned: LMA  Additional Equipment: None  Intra-op Plan:   Post-operative Plan: Extubation in OR  Informed Consent: I have reviewed the patients History and Physical, chart, labs and discussed the procedure including the risks, benefits and alternatives for the proposed anesthesia with the patient or authorized representative who has indicated his/her understanding and acceptance.     Dental advisory given  Plan Discussed with:   Anesthesia Plan Comments:        Anesthesia Quick Evaluation

## 2020-05-19 NOTE — Anesthesia Procedure Notes (Signed)
Procedure Name: LMA Insertion Date/Time: 05/19/2020 1:17 PM Performed by: Sheryn Bison, CRNA Pre-anesthesia Checklist: Patient identified, Emergency Drugs available, Suction available and Patient being monitored Patient Re-evaluated:Patient Re-evaluated prior to induction Oxygen Delivery Method: Circle System Utilized Preoxygenation: Pre-oxygenation with 100% oxygen Induction Type: IV induction Ventilation: Mask ventilation without difficulty LMA: LMA inserted LMA Size: 4.0 Number of attempts: 1 Airway Equipment and Method: bite block Placement Confirmation: positive ETCO2 Tube secured with: Tape Dental Injury: Teeth and Oropharynx as per pre-operative assessment

## 2020-05-19 NOTE — Op Note (Signed)
NAME: Lisa Bates MEDICAL RECORD NO: [id number] DATE OF BIRTH: [birth year] FACILITY: [facility] LOCATION: [facility] PHYSICIAN: Tami Ribas, MD   OPERATIVE REPORT   DATE OF PROCEDURE: 05/19/20    PREOPERATIVE DIAGNOSIS:   Right thumb laceration with possible tendon artery nerve laceration   POSTOPERATIVE DIAGNOSIS:   Right thumb laceration with ulnar digital nerve and artery laceration   PROCEDURE:   Right thumb exploration of wound with repair of ulnar digital nerve under microscope   SURGEON:  Betha Loa, M.D.   ASSISTANT: Cindee Salt, MD   ANESTHESIA:  General   INTRAVENOUS FLUIDS:  Per anesthesia flow sheet.   ESTIMATED BLOOD LOSS:  Minimal.   COMPLICATIONS:  None.   SPECIMENS:   Cultures to micro   TOURNIQUET TIME:    Total Tourniquet Time Documented: Upper Arm (Right) - 45 minutes Total: Upper Arm (Right) - 45 minutes    DISPOSITION:  Stable to PACU.   INDICATIONS: 29 year old female states she sustained a laceration to her right thumb approximately  2-1/2 weeks ago.  She was seen at [facility] where the wound was cleaned and sutured.  She follow-up in the office.  She has decreased sensation on the ulnar side of the thumb.  She wishes to proceed with operative exploration and repair of tendon artery nerve is necessary.  Risks, benefits and alternatives of surgery were discussed including the risks of blood loss, infection, damage to nerves, vessels, tendons, ligaments, bone for surgery, need for additional surgery, complications with wound healing, continued pain, stiffness.  She voiced understanding of these risks and elected to proceed.  OPERATIVE COURSE:  After being identified preoperatively by myself,  the patient and I agreed on the procedure and site of the procedure.  The surgical site was marked.  Surgical consent had been signed. She was given IV antibiotics as preoperative antibiotic prophylaxis. She was transferred to the  operating room and placed on the operating table in supine position with the Right Right upper extremity on an arm board.  General anesthesia was induced by the anesthesiologist.  Right upper extremity was prepped and draped in normal sterile orthopedic fashion.  A surgical pause was performed between the surgeons, anesthesia, and operating room staff and all were in agreement as to the patient, procedure, and site of procedure.  Tourniquet at the proximal aspect of the extremity was inflated to 250 mmHg after exsanguination of the arm with an Esmarch bandage.    The wound was explored.  There was granulation tissue within the wound.  No gross purulence.  There was fluid.  Cultures were taken for aerobes and anaerobes.  The wound was extended proximally.  The ulnar digital nerve was identified and had been lacerated.  The ulnar digital artery was lacerated.  The distal stump of the artery was retrievable.  The proximal stump was not able to be located.  This was not a repairable injury to the artery.  The proximal stump of the ulnar digital nerve was identified.  The radial digital nerve was identified and was intact.  The wound was copiously irrigated with sterile saline.  The microscope was brought in.  This was used for microdissection of the ulnar digital nerve.  The ends were freshened back to butting fascicles.  9-0 nylon suture was used in an interrupted circumferential fashion to reapproximate the nerve ends.  Good reapproximation was obtained.  The wound was then closed with 4-0 nylon suture in a horizontal mattress fashion.  Vessel loop drains were placed in the wound.  The wound was then injected with quarter percent plain Marcaine to aid in postoperative analgesia.  It was dressed with sterile Xeroform 4 x 4's and wrapped with a Kerlix bandage.  Dorsal blocking thumb spica splint was placed and wrapped with Kerlix and Ace bandage.  The tourniquet was deflated at 45 minutes.  Fingertips were pink with  brisk capillary refill after deflation of tourniquet.  The operative  drapes were broken down.  The patient was awoken from anesthesia safely.  She was transferred back to the stretcher and taken to PACU in stable condition.  I will see her back in the office in 1 week for postoperative followup.  I will give her a prescription for Norco 5/325 1-2 tabs PO q6 hours prn pain, dispense # 20 and Bactrim DS 1 p.o. twice daily x7 days.   Betha Loa, MD Electronically signed, 05/19/20

## 2020-05-19 NOTE — Transfer of Care (Signed)
Immediate Anesthesia Transfer of Care Note  Patient: Lisa Bates  Procedure(s) Performed: RIGHT THUMB WOUND EXPLORATION (Right Hand) RIGHT THUMB NERVE REPAIR (Right Hand)  Patient Location: PACU  Anesthesia Type:General  Level of Consciousness: drowsy and patient cooperative  Airway & Oxygen Therapy: Patient Spontanous Breathing and Patient connected to face mask oxygen  Post-op Assessment: Report given to RN and Post -op Vital signs reviewed and stable  Post vital signs: Reviewed and stable  Last Vitals:  Vitals Value Taken Time  BP 115/53 05/19/20 1423  Temp    Pulse 91 05/19/20 1424  Resp 16 05/19/20 1424  SpO2 98 % 05/19/20 1424  Vitals shown include unvalidated device data.  Last Pain:  Vitals:   05/19/20 1205  TempSrc: Oral  PainSc: 5       Patients Stated Pain Goal: 5 (05/19/20 1205)  Complications: No complications documented.

## 2020-05-20 ENCOUNTER — Encounter (HOSPITAL_BASED_OUTPATIENT_CLINIC_OR_DEPARTMENT_OTHER): Payer: Self-pay | Admitting: Orthopedic Surgery

## 2020-05-24 LAB — AEROBIC/ANAEROBIC CULTURE W GRAM STAIN (SURGICAL/DEEP WOUND): Gram Stain: NONE SEEN

## 2022-02-13 IMAGING — CR DG HAND COMPLETE 3+V*R*
3 series · 3 of 3 positions shown · non-contrast
Comparison: None.

CLINICAL DATA: Hand laceration with broken glass.

EXAM:
RIGHT HAND - COMPLETE 3+ VIEW

[x hand pa right]
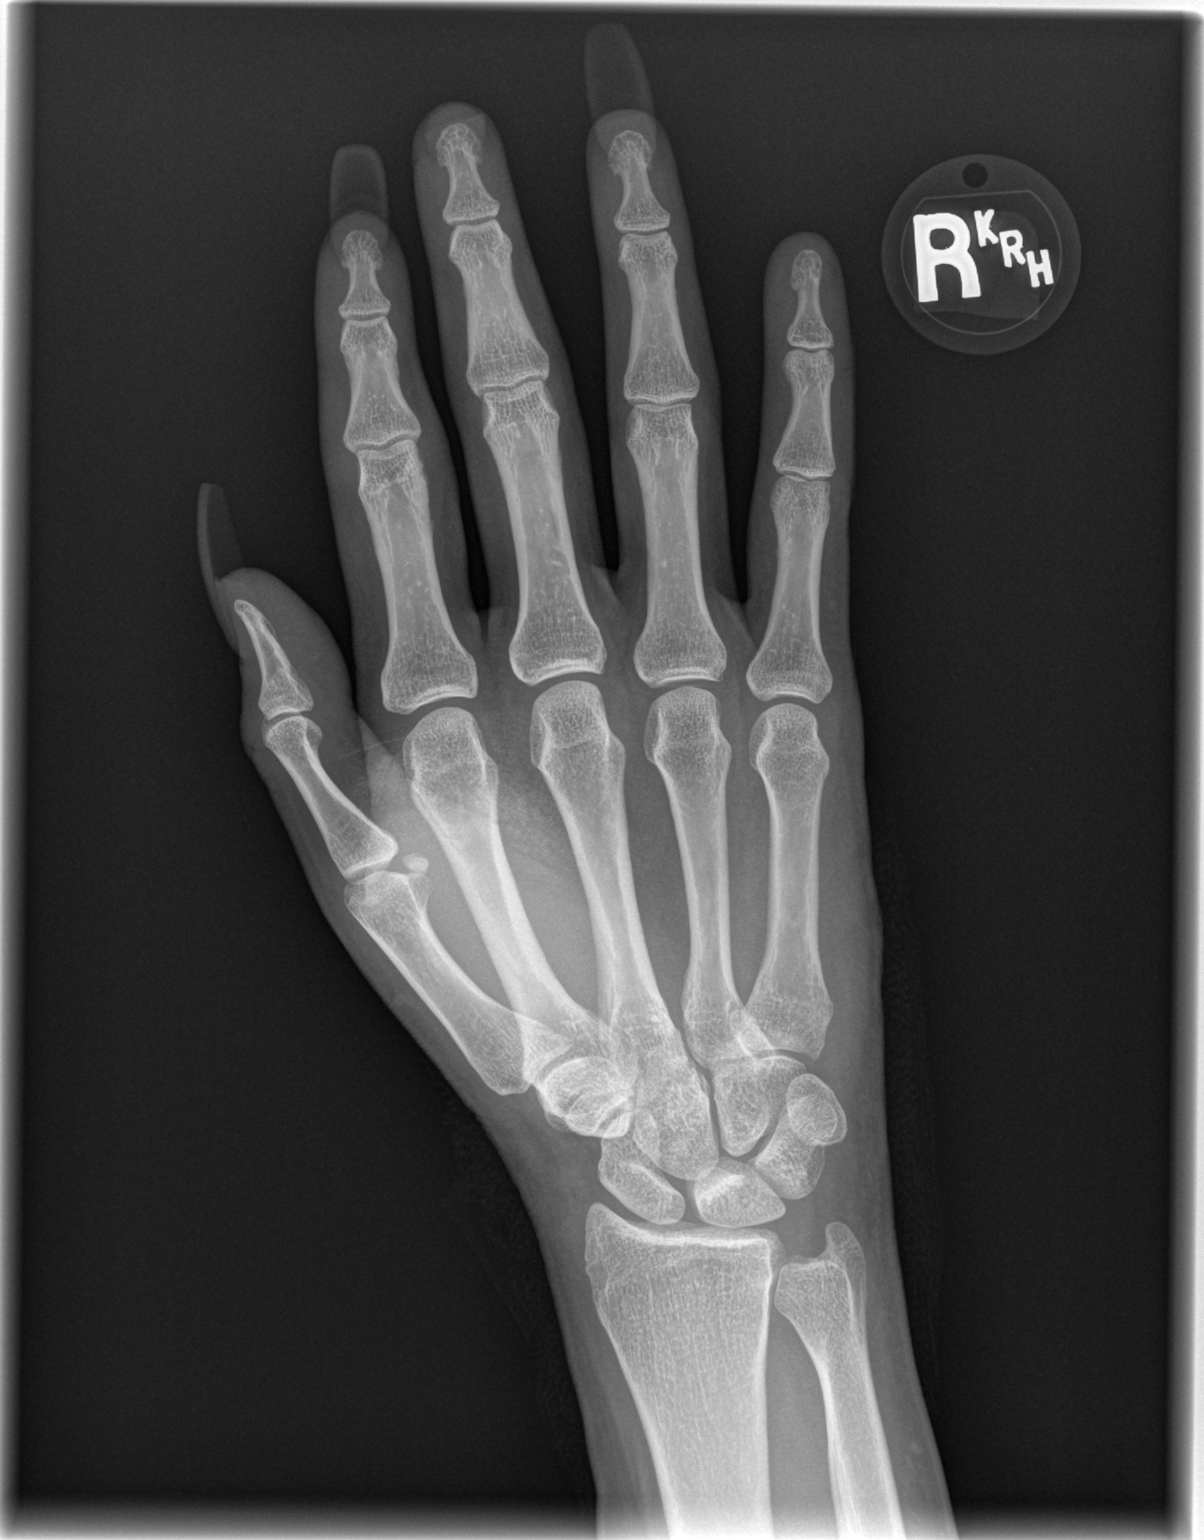

[x hand oblique right]
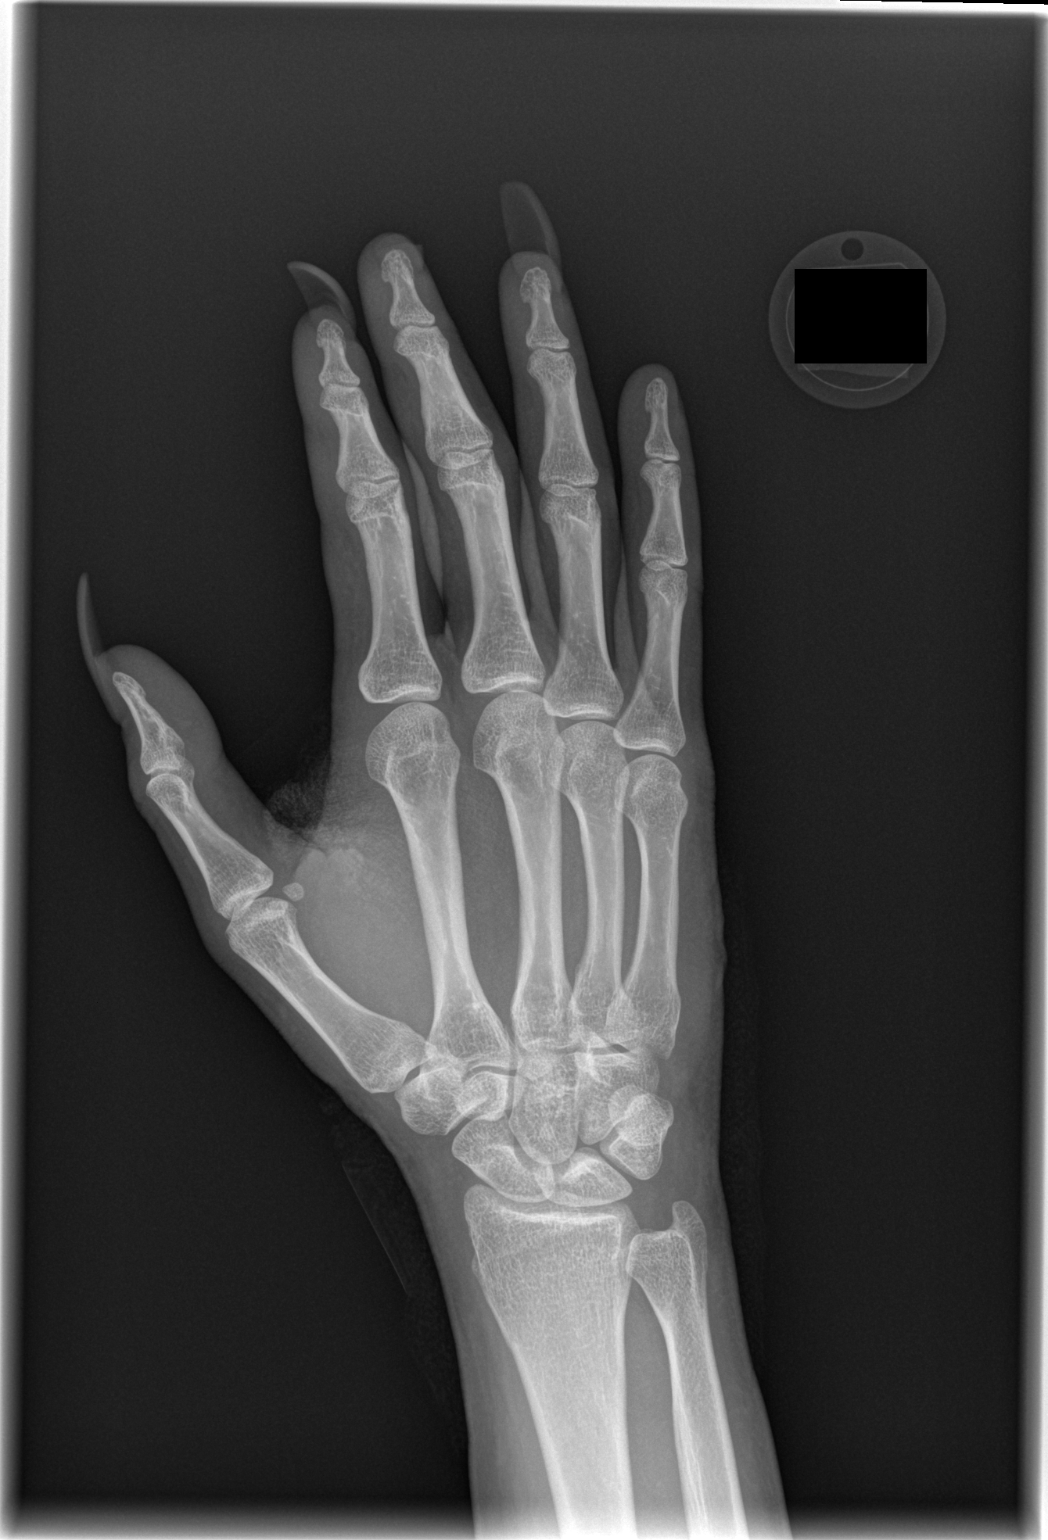

[x hand lat right]
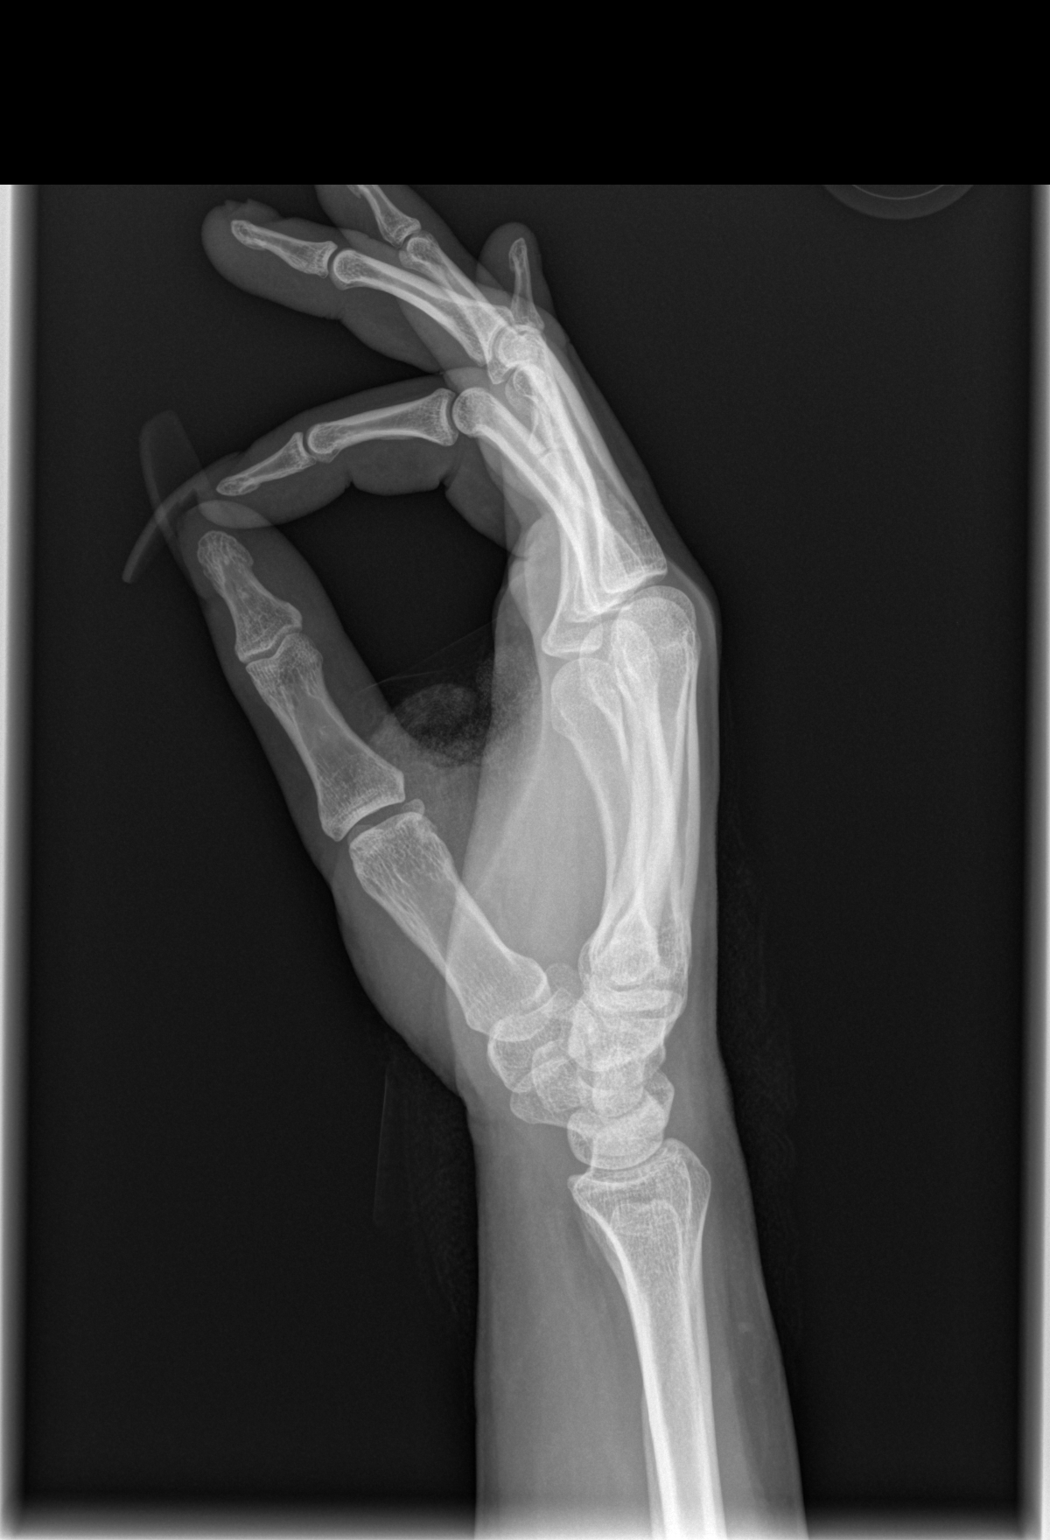

[3 of 3 positions shown; findings below may reference images not displayed]

FINDINGS: There is no evidence of fracture or dislocation. There is no
evidence of arthropathy or other focal bone abnormality. No
radiopaque foreign body.
IMPRESSION: No fracture or radiopaque foreign body.
